# Patient Record
Sex: Male | Born: 1965 | Race: Black or African American | Hispanic: No | Marital: Married
Health system: Midwestern US, Community
[De-identification: ages and names within clinical notes are randomized; demographics above are authoritative.]

---

## 2000-01-29 ENCOUNTER — Encounter: Admission: RE | Admit: 2000-01-29 | Discharge: 2000-01-29 | Payer: Self-pay | Admitting: *Deleted

## 2000-01-29 ENCOUNTER — Encounter: Payer: Self-pay | Admitting: *Deleted

## 2002-02-05 ENCOUNTER — Encounter: Payer: Self-pay | Admitting: Family Medicine

## 2002-02-05 ENCOUNTER — Inpatient Hospital Stay (HOSPITAL_COMMUNITY): Admission: RE | Admit: 2002-02-05 | Discharge: 2002-02-07 | Payer: Self-pay | Admitting: Family Medicine

## 2002-08-10 ENCOUNTER — Emergency Department (HOSPITAL_COMMUNITY): Admission: EM | Admit: 2002-08-10 | Discharge: 2002-08-10 | Payer: Self-pay | Admitting: Emergency Medicine

## 2005-01-30 ENCOUNTER — Emergency Department (HOSPITAL_COMMUNITY): Admission: EM | Admit: 2005-01-30 | Discharge: 2005-01-30 | Payer: Self-pay | Admitting: Emergency Medicine

## 2005-02-28 ENCOUNTER — Inpatient Hospital Stay (HOSPITAL_COMMUNITY): Admission: EM | Admit: 2005-02-28 | Discharge: 2005-03-02 | Payer: Self-pay | Admitting: Family Medicine

## 2005-03-01 ENCOUNTER — Ambulatory Visit: Payer: Self-pay | Admitting: *Deleted

## 2005-03-02 ENCOUNTER — Encounter: Payer: Self-pay | Admitting: Cardiovascular Disease

## 2005-03-04 ENCOUNTER — Emergency Department (HOSPITAL_COMMUNITY): Admission: EM | Admit: 2005-03-04 | Discharge: 2005-03-04 | Payer: Self-pay | Admitting: Emergency Medicine

## 2005-03-09 ENCOUNTER — Ambulatory Visit: Payer: Self-pay | Admitting: *Deleted

## 2005-03-23 ENCOUNTER — Ambulatory Visit (HOSPITAL_COMMUNITY): Admission: RE | Admit: 2005-03-23 | Discharge: 2005-03-23 | Payer: Self-pay | Admitting: *Deleted

## 2005-03-23 ENCOUNTER — Ambulatory Visit: Payer: Self-pay | Admitting: *Deleted

## 2005-04-07 ENCOUNTER — Ambulatory Visit: Payer: Self-pay | Admitting: *Deleted

## 2006-10-05 ENCOUNTER — Emergency Department (HOSPITAL_COMMUNITY): Admission: EM | Admit: 2006-10-05 | Discharge: 2006-10-05 | Payer: Self-pay | Admitting: Emergency Medicine

## 2006-10-07 ENCOUNTER — Emergency Department (HOSPITAL_COMMUNITY): Admission: EM | Admit: 2006-10-07 | Discharge: 2006-10-07 | Payer: Self-pay | Admitting: Emergency Medicine

## 2007-10-14 ENCOUNTER — Emergency Department (HOSPITAL_COMMUNITY): Admission: EM | Admit: 2007-10-14 | Discharge: 2007-10-14 | Payer: Self-pay | Admitting: Emergency Medicine

## 2008-01-22 ENCOUNTER — Emergency Department (HOSPITAL_COMMUNITY): Admission: EM | Admit: 2008-01-22 | Discharge: 2008-01-22 | Payer: Self-pay | Admitting: Emergency Medicine

## 2008-12-17 IMAGING — CT CT HEAD W/O CM
1 series · 16 of 30 positions shown, 20 images · IV contrast (agent unspecified)
Comparison: 03/04/05.

CLINICAL DATA: Headache.  Visual disturbance. Right eye vision loss.  
 HEAD CT WITHOUT CONTRAST:
TECHNIQUE: Contiguous axial images were obtained from the base of the skull through the vertex according to standard protocol without contrast.

[Series 2: brain · axial · 0.47mm/px · z∈[+103,+245]mm · 16 of 32 slices shown, 20 images]
[im 2/32  brain]
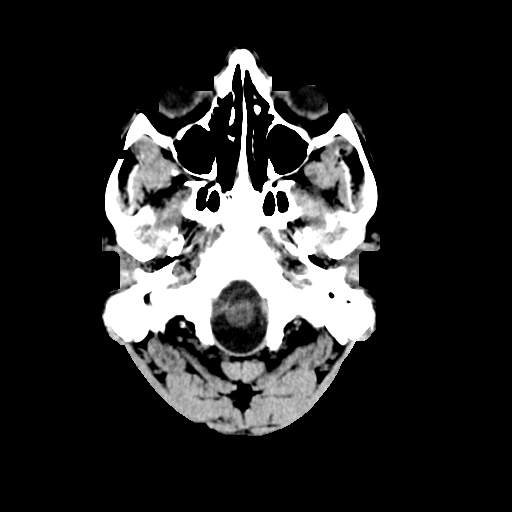
[im 2/32  bone]
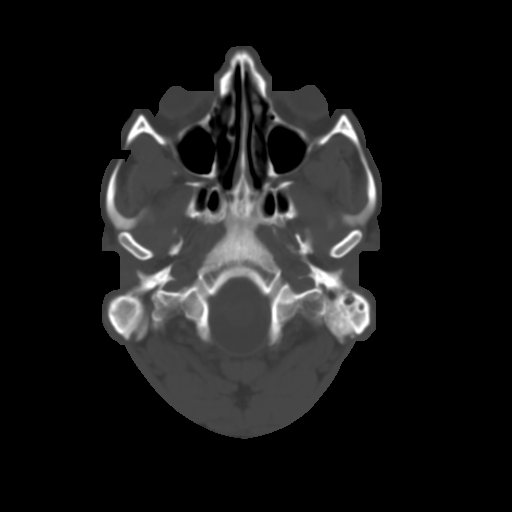
[im 4/32  brain]
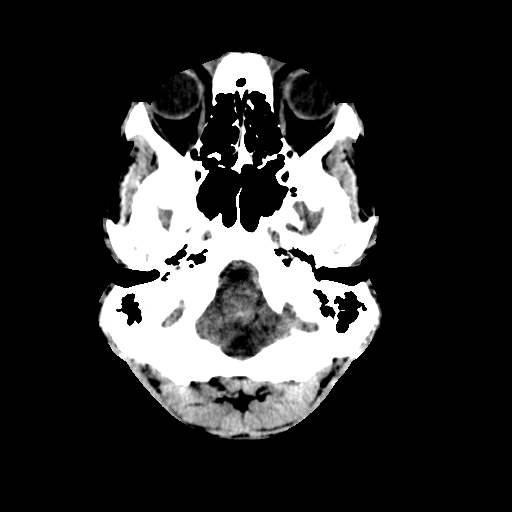
[im 6/32  brain]
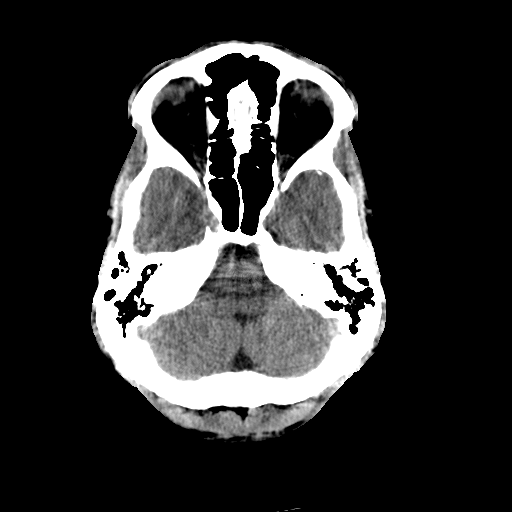
[im 8/32  brain]
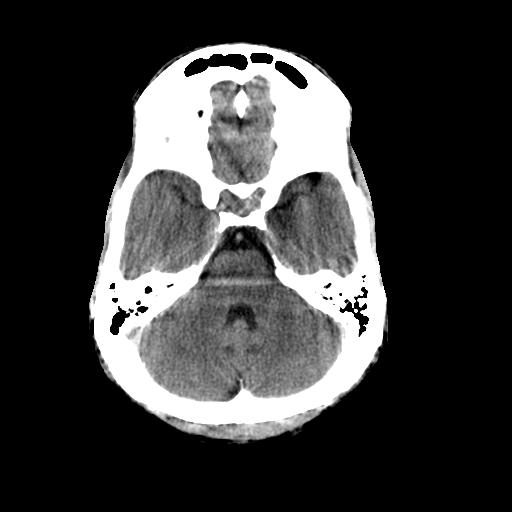
[im 9/32  brain]
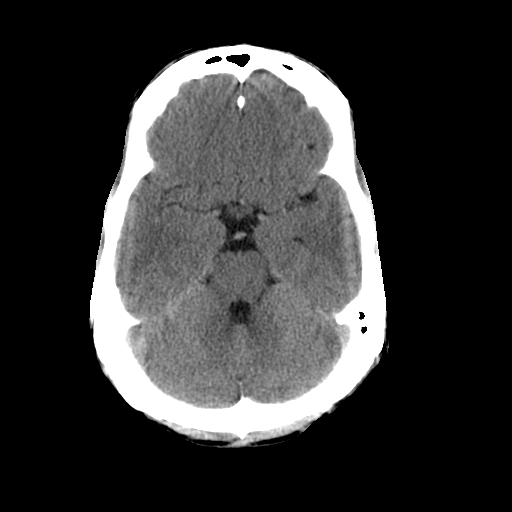
[im 9/32  bone]
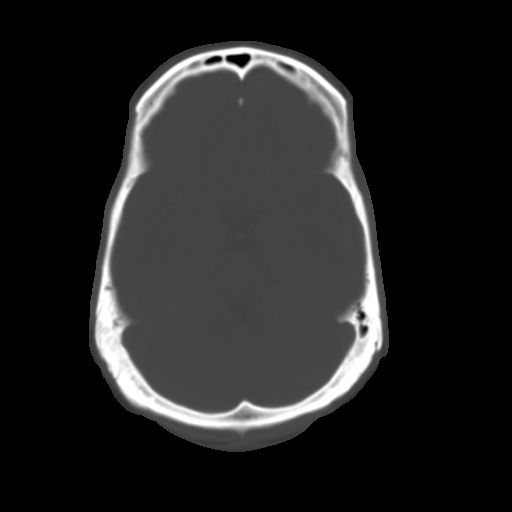
[im 11/32  brain]
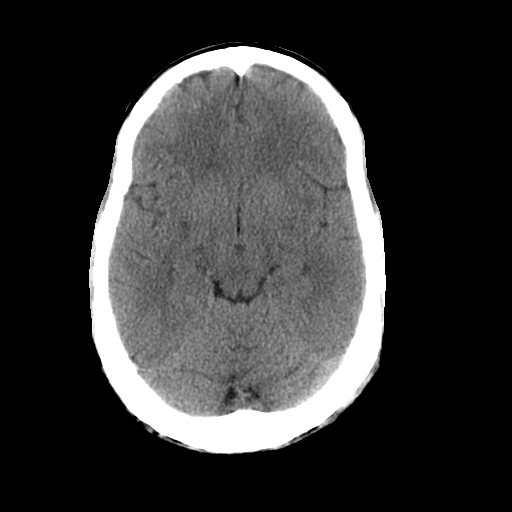
[im 13/32  brain]
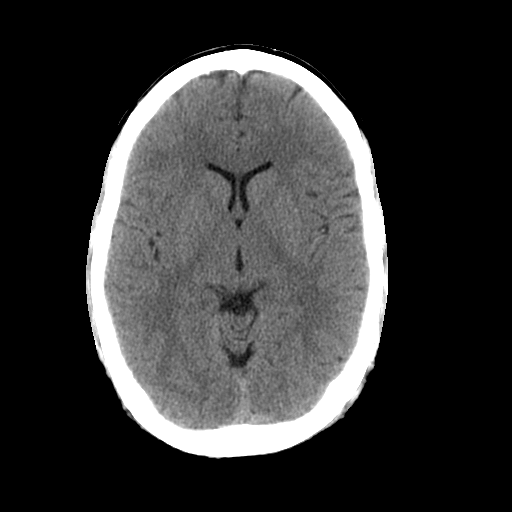
[im 15/32  brain]
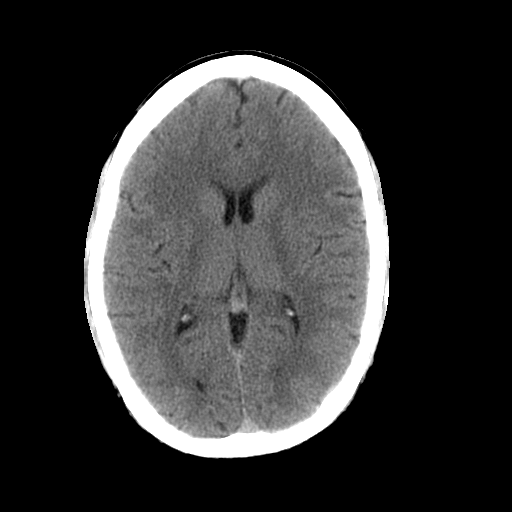
[im 17/32  brain]
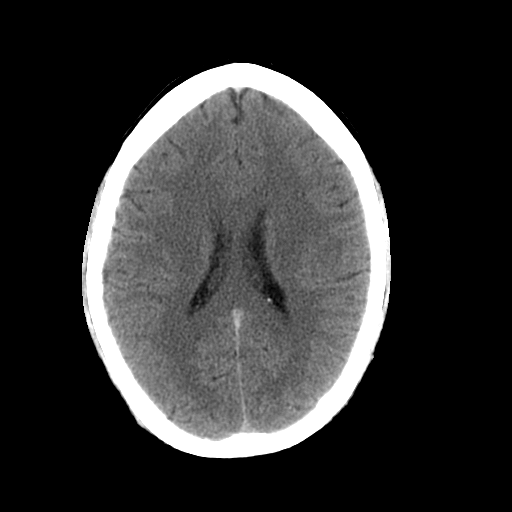
[im 17/32  bone]
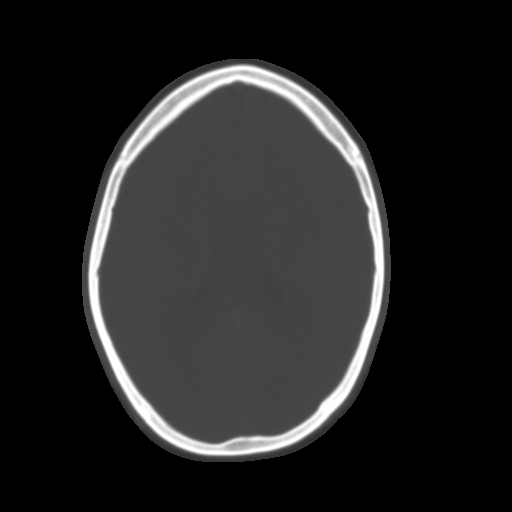
[im 19/32  brain]
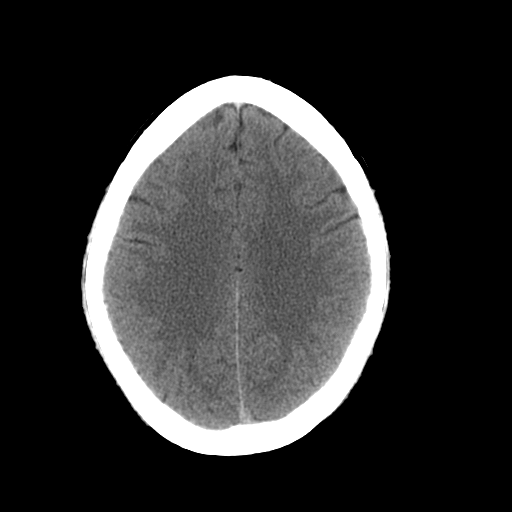
[im 21/32  brain]
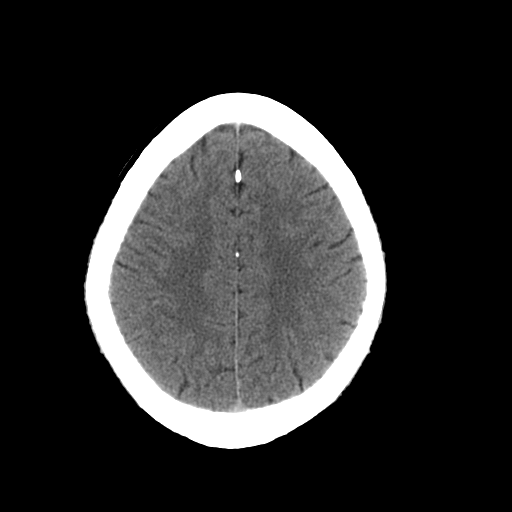
[im 23/32  brain]
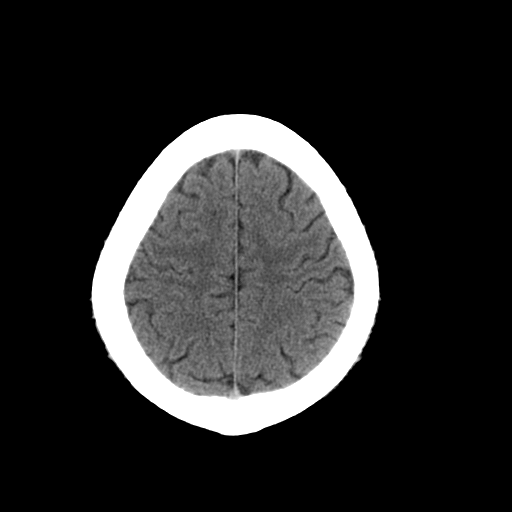
[im 24/32  brain]
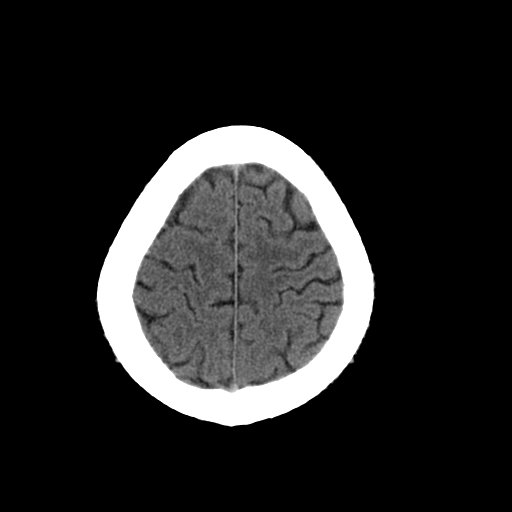
[im 24/32  bone]
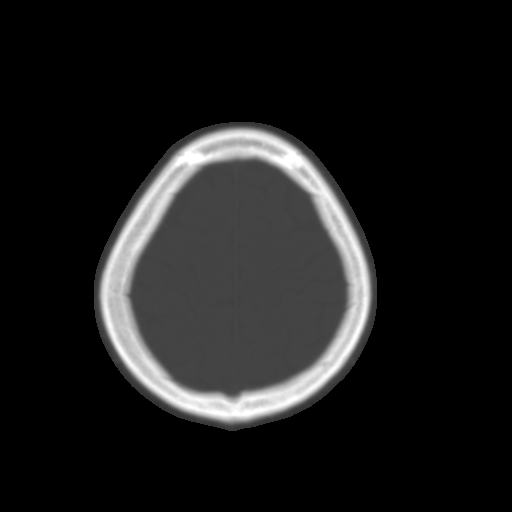
[im 26/32  brain]
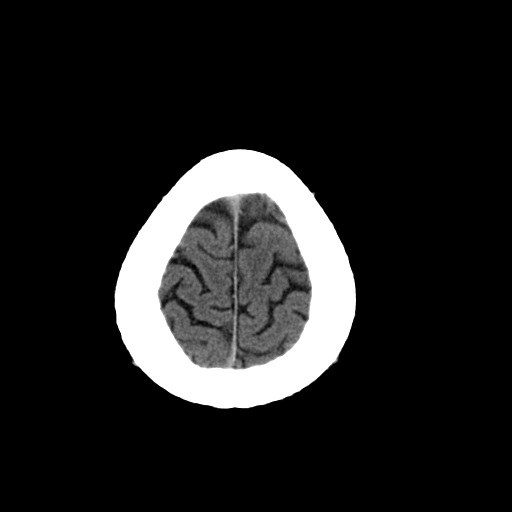
[im 28/32  brain]
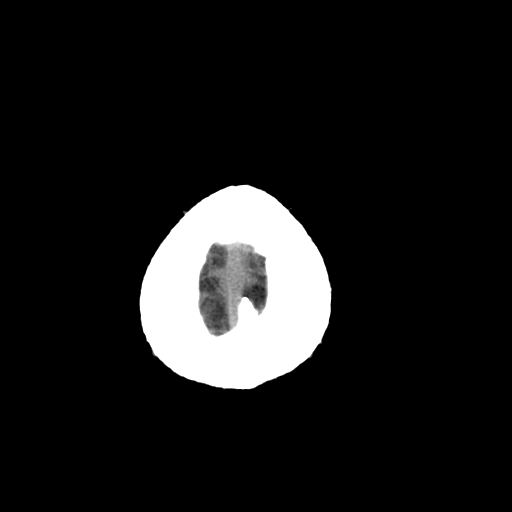
[im 30/32  brain]
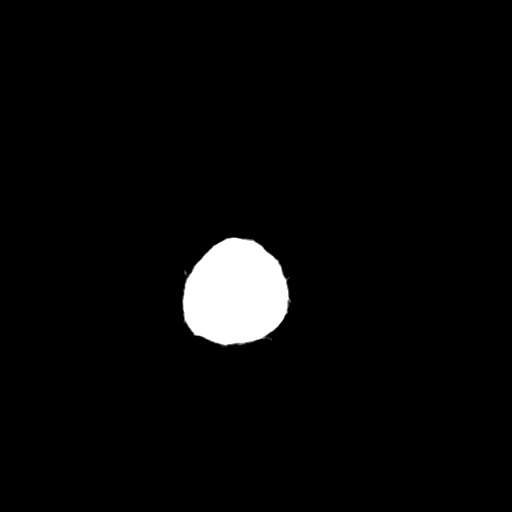

[16 of 30 positions shown; findings below may reference images not displayed]

FINDINGS: The brain has a normal appearance without evidence of atrophy, stroke, mass, hemorrhage, hydrocephalus, or extraaxial collection.  The calvarium is unremarkable.  The paranasal sinuses, middle ears and mastoids are clear.
IMPRESSION: Normal examination.

## 2011-04-08 NOTE — Consult Note (Signed)
NAME:  Darryl [name], Darryl [name]             ACCOUNT NO.:  [id number]   MEDICAL RECORD NO.:  [id number]          PATIENT TYPE:  INP   LOCATION:  A203                          FACILITY:  [facility]   PHYSICIAN:  Vida Roller, M.D.   DATE OF BIRTH:  May 12, 1966   DATE OF CONSULTATION:  03/01/2005  DATE OF DISCHARGE:                                   CONSULTATION   PRIMARY CARE PHYSICIAN:  Scott A. Luking, MD   HISTORY OF PRESENT ILLNESS:  Mr. Durbin is a 45 year old black male with  no significant past medical history except for migraine headaches who  presented with two days of discomfort in his chest.  Was admitted by Dr.  Gerda Diss on February 28, 2005.  Ruled out for myocardial infarction by enzymes  and EKGs.  He underwent an exercise perfusion study today that showed  evidence of inferior wall ischemia, and we were asked to evaluate him.  His  chest discomfort appears to be sharp.  It is nearly constant with a waxing  and waning feature.  It is no associated with any exertion, although he did  have some on the treadmill today.  He denies any fevers or chills, nausea or  vomiting.  He has no diaphoresis.  There are no palpitations.  There is no  radiation to his pain.  He has no PND or orthopnea.  He is an otherwise  healthy guy.  He has no past medical history other than the migraine  headaches for which he takes verapamil on a regular basis.  He does not  smoke, drink, or use illicit drugs.  He is not allergic to any medications.  He has a family history of prostate cancer but no heart disease, no  diabetes.  He does have a family history of hypertension.   REVIEW OF SYSTEMS:  Generally negative except for that reviewed in the  history of present illness.  Negative for headache, shortness of breath,  tingling.  He has no sinus discharge.  No visual changes.  No changes in his  hearing.  He does not have any wheezing.  There are no thyroid problems.  He  has no changes in his appetite.  There is  no melena or hematochezia.  There  is no history of dyspepsia.  He has never had any problems with his urinary  stream or his sexual performance.  He has no joint pain.  The rest of his  review of systems is negative.   PHYSICAL EXAMINATION:  VITAL SIGNS:  Pulse 54, blood pressure 130/84.  GENERAL:  He is a well-developed and well-nourished African-American male,  kind of thin, in no apparent distress.  Alert and oriented x4.  HEENT:  Unremarkable.  NECK:  Supple.  There is no jugular venous distention or carotid bruits.  CHEST:  Clear to auscultation bilaterally.  CARDIAC:  Regular. There is an S4 but otherwise a normal exam with  nondisplaced point of maximal impulse.  ABDOMEN:  Soft and nontender with no bruits.  EXTREMITIES:  Without clubbing, cyanosis or edema.  Distal pulses are intact  to 2+ with no evidence  of bruits.   His electrocardiogram shows sinus bradycardia at a rate of 49 with normal  intervals.  His P-R interval is 208 milliseconds but otherwise normal.  He  has voltage criteria for left ventricular hypertrophy with early  repolarization, which is probable normal variant for his age.  He has no  ischemic ST/T wave changes, and there are no T waves concerning for an old  myocardial infarction.  He does have T waves in leads V4, V5, and V6, which  are not consistent with myocardial infarction.   Chest x-ray shows no acute disease.   Routine screening laboratories are all within normal limits.   Stress test is as described above.   This is a gentleman with atypical chest pain but with an abnormal perfusion  study who had excellent exercise tolerance.  No ischemia on his resting  electrocardiogram with normal left ventricular systolic function.  I suspect  this is probably a false positive test, but he reproduces discomfort on the  treadmill, so I think it is probably worthwhile to exclude significant  coronary disease and also coronary anomaly.  It is possible with  his  migraine history that he may have a vasospastic anginal component to this,  although that is very unlikely.  He also has a history of migraine  headaches, which is well controlled on verapamil.  So my plan is to  transport him to [facility].  We will use Lovenox and aspirin until he is  cathed.  I am going to change from verapamil to Lopressor and will move  forward with his evaluation.      JH/MEDQ  D:  03/01/2005  T:  03/01/2005  Job:  [id number]

## 2011-04-08 NOTE — Discharge Summary (Signed)
NAME:  Rosebrook, Jasean             ACCOUNT NO.:  [id number]   MEDICAL RECORD NO.:  [id number]          PATIENT TYPE:  INP   LOCATION:                               FACILITY:  [facility]   PHYSICIAN:  Dorian Pod, NP    DATE OF BIRTH:  [birth year]   DATE OF ADMISSION:  02/28/2005  DATE OF DISCHARGE:  03/02/2005                                 DISCHARGE SUMMARY   DISCHARGE DIAGNOSES:  1.  Chest pain, negative cardiac enzymes, status post cardiac      catheterization on March 01, 2005.  No obstructive coronary artery      disease.  2.  History of migraines.   PAST MEDICAL HISTORY:  Appendicitis in 2003 status post appendectomy, and  migraine headaches with initiation of beta blocker x3-4 weeks prior to this  admission.   DISPOSITION:  Home with instructions to continue his previous medications  which include:  1.  Lopressor 50 mg p.o. b.i.d.  2.  Aspirin 325 mg p.o. daily.   FOLLOW UP:  Dr. Marchelle Folks PA in two weeks at the [place] office.  The  patient instructed to call her office at [phone] for followup appointment.  He is also instructed to call our office for any fever, any pain, or  swelling from cath site.   DIET:  Low fat.   ACTIVITY:  No lifting over 10 pounds x1 week, no driving for two days.  Tylenol for general discomfort.   HOSPITAL COURSE:  Mr. Froh is a patient of Dr. Lilyan Punt.  He is a  45 year old African-American gentleman with history of migraine headaches  who initially presented to [facility] with complaints of x2 days of  discomfort in his chest.  Was admitted there by Dr. Gerda Diss.  Ruled out for  myocardial infarction by enzymes and EKG.  Underwent an exercise stress test  that showed evidence of inferior wall ischemia, and we were asked to  evaluate him further.  The patient was transferred to [facility] for cardiac  catheterization.  Taken to the cath lab on April 11, left heart cath.  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  However, the LAD  abnormality is not in the same distribution as the Cardiolite defect.  It  does not appear to be flow-limiting and risk factor reduction will be  recommended.  The patient tolerated catheterization without any  complications.  Post cath seen by Dr. Riley Kill.  Afebrile.  Lab work  stable with hemoglobin of 14.2, hematocrit 41.6, BUN 14, creatinine 1.1,  triglycerides 125, total cholesterol 158, HDL 41, LDL 92, the patient in  normal sinus rhythm.  Cath site without complications.  The patient  discharged home with followup instructions as stated above.      MB/MEDQ  D:  04/19/2005  T:  04/19/2005  Job:  [id number]   cc:   Lorin Picket A. Gerda Diss, MD  [address]  [place]  [place] [address]  Fax: [phone]

## 2011-04-08 NOTE — Op Note (Signed)
[facility]  Patient:    [facility], Bralyn A Visit Number: [id number] MRN: [id number]          Service Type: MED Location: 3A A335 01 Attending Physician:  Lilyan Punt Dictated by:   Elpidio Anis, M.D. Admit Date:  02/05/2002 Discharge Date: 02/07/2002   CC:         Donna Bernard, M.D.   Operative Report  PREOPERATIVE DIAGNOSIS:  Acute appendicitis.  POSTOPERATIVE DIAGNOSIS:  Acute appendicitis.  PROCEDURE:  Laparoscopic appendectomy.  SURGEON:  Elpidio Anis, M.D.  DESCRIPTION OF PROCEDURE:  Under general anesthesia, the patients abdomen was prepped and draped in a sterile field.  Subumbilical incision was made and Veress needle was inserted uneventfully.   Abdomen was insufflated with three liters of CO2.  He is _____ .  A 10 mm port was placed uneventfully. Laparoscope was placed.  A midline suprapubic incision was made and a 5 mm port was placed under videoscopic guidance.  Thereby a lower quadrant transverse incision was made and a 12 mm port was placed under videoscopic guidance.  The cecum was identified and mobilized.  There were dense adhesions of the cecum and the appendix in the right gutter.  There was also evidence of acute inflammation of the tip of the appendix.  The adhesions were dissected with a _____ clamp, clipped with multiple clips and divided.  The mesoappendix was dissected at the base.  It was divided using an endo-GIA with 2.5 mm staplers.  The base of the appendix was then transected using the same stapler.  Hemostasis was achieved.  There was no bleeding noted.  The appendix was placed in an endocatch device and then retrieved uneventfully.  Further irrigation was carried out.  There was no bleeding.  The staple line appeared to be intact.  The patient tolerated the procedure well.  CO2 was allowed to escape from the abdomen and the ports were removed.  The incisions were closed using 0 Dexon on the fascia of the  larger incisions and staples on the skin. Dressings were placed.  He was awakened from anesthesia uneventfully and transferred to a bed and taken to the post anesthesia care unit for further monitoring. Dictated by:   Elpidio Anis, M.D. Attending Physician:  Lilyan Punt DD:  02/06/02 TD:  02/08/02 Job: [id number]

## 2011-04-08 NOTE — Procedures (Signed)
NAME:  Darryl [name], Darryl [name]             ACCOUNT NO.:  [id number]   MEDICAL RECORD NO.:  [id number]          PATIENT TYPE:  INP   LOCATION:  A203                          FACILITY:  [facility]   PHYSICIAN:  Scott A. Luking, MD    DATE OF BIRTH:  July 10, 1966   DATE OF PROCEDURE:  DATE OF DISCHARGE:                                    STRESS TEST   INDICATION:  Chest discomfort and abnormal electrocardiogram.  Resting EKG  shows a normal sinus rhythm and no acute ST segment changes.  Early  repolarization noted to be 5B6.   PROTOCOL:  Bruce protocol with Cardiolite.   EXERCISE PHASE:  No acute symptomatology or ST segment changes were noted.  No arrhythmias.  Blood pressure did go up, but not drastically.   RECOVERY PHASE:  Uneventful.  No arrhythmias.   INTERPRETATION:  Normal stress test.  Await Cardiolite images.      SAL/MEDQ  D:  03/01/2005  T:  03/01/2005  Job:  [id number]

## 2011-04-08 NOTE — Procedures (Signed)
NAME:  Darryl [name], Darryl [name]             ACCOUNT NO.:  [id number]   MEDICAL RECORD NO.:  [id number]          PATIENT TYPE:  OUT   LOCATION:  RAD                           FACILITY:  [facility]   PHYSICIAN:  Vida Roller, M.D.   DATE OF BIRTH:  1966-09-30   DATE OF PROCEDURE:  03/23/2005  DATE OF DISCHARGE:                                  ECHOCARDIOGRAM   PRIMARY CARE PHYSICIAN:  Scott A. Luking, MD   Tape Number [id number], tape count [id number] through 6231.   This is a 45 year old man with hypertension.   Technical quality study is difficult, although the echocardiographic windows  appear excellent.   M-MOD TRACINGS:  Aorta is 25 mm.   Left atrium is 38 mm.   The septum is 11 mm.   Posterior wall is 11 mm.   Left ventricular diastolic dimension is 43 mm.   Left ventricular systolic dimension is 34 mm.   2-D AND DOPPLER IMAGING:  The left ventricle is normal size with low-normal  ejection fraction, estimated ejection fraction would be 50-55%. There are no  obvious wall motion abnormalities. Borderline left ventricular hypertrophy,  which is concentric.   The right ventricle appears to be top-normal in size with normal systolic  function.   Both atria appear to be normal size.   The aortic valve is morphologically unremarkable with no stenosis or  regurgitation.   The mitral valve is morphologically unremarkable with trivial insufficiency.  No stenosis is seen.   Tricuspid valve is morphologically unremarkable with no stenosis or  regurgitation.   Pulmonic valve not well seen.   Inferior vena cava appears normal size.   There is no pericardial effusion.   Assessment if the aorta is poor. There is no assessment of the suprasternal  notch view.   ASSESSMENT:  Unremarkable echocardiogram.      JH/MEDQ  D:  03/23/2005  T:  03/23/2005  Job:  [id number]   cc:   Lorin Picket A. Gerda Diss, MD  [address]  [place]  [place] [address]  Fax: [phone]

## 2011-04-08 NOTE — Discharge Summary (Signed)
[facility]  Patient:    [facility], Talon A Visit Number: [id number] MRN: [id number]          Service Type: MED Location: 3A A335 01 Attending Physician:  Lilyan Punt Dictated by:   Lilyan Punt, M.D. Admit Date:  02/05/2002 Discharge Date: 02/07/2002                             Discharge Summary  DISCHARGE DIAGNOSES: 1. Acute appendicitis. 2. Vomiting secondary to #1.  HOSPITAL COURSE:  A 45 year old black male admitted in with severe abdominal pain localized to the right quadrant area.  The scan showed inflammation in the appendix.  He is placed on IV fluids, IV antibiotics, had surgery the following morning.  He tolerated that well and overall did fine and was able to be discharged to home on the following day and instructed to follow up if any problems, otherwise, followup with Dr. Katrinka Blazing as advised by him and to take it easy over the course of the next several days.  The patient tolerated the procedure well without any complications. Dictated by:   Lilyan Punt, M.D. Attending Physician:  Lilyan Punt DD:  02/22/02 TD:  02/23/02 Job: [id number]

## 2011-04-08 NOTE — H&P (Signed)
[facility]  Patient:    Darryl [name], Darryl [name] Visit Number: [id number] MRN: [id number]          Service Type: MED Location: 2A A218 01 Attending Physician:  Lilyan Punt Dictated by:   Lilyan Punt, M.D. Admit Date:  02/05/2002                           History and Physical  CHIEF COMPLAINT:  Abdominal pain, right side.  HISTORY OF PRESENT ILLNESS:  This 45 year old black male relates onset of abdominal pain early in the morning.  It became more progressive as the day went on and localized to the right lower quadrant.  He states he had difficulty moving about, walking around and states any jarring in the car made it hurt worse.  He stated that the pain started off as [name] 3/10 but is now currently an 8/10.  He did eat lunch at [employer] but felt slightly nauseated, has not had anything since then.  Denies dysuria but does relate increased urinary frequency.  He denies cough.  He denies shortness of breath and chest tightness.  No fever or chills.  No rectal bleeding, diarrhea or vomiting.  Relates nausea.  PAST MEDICAL HISTORY:  No surgeries.  No medical problems.  SOCIAL HISTORY:  Does not smoke or drink.  ALLERGIES:  No allergies.  FAMILY HISTORY:  Hypertension.  Dad had prostate cancer.  REVIEW OF SYSTEMS:  Negative for headaches, see per above for other details.  PHYSICAL EXAMINATION:  GENERAL:  NAD.  Looks to feel in somewhat pain.  VITAL SIGNS:  BP 130/100; recheck was 126/84.  Temperature 97.2.  Weight 160 pounds.  HEENT:  TMs NL.  T - NL.  NECK:  Supple No masses.  CHEST:  CTA.  HEART:  NL.  Heart is regular.  No murmurs.  ABDOMEN:  Soft on the left side but tender on the right with exquisite tenderness in the right lower quadrant.  No rebound.  Rates pain as an 8/10.  RECTAL:  Prostate WNL.  LABORATORY AND ACCESSORY DATA:  White count 9000.  Other labs are pending currently.  Urinalysis negative in the office.  CT scan  shows prominent appendix, suspect early appendicitis.  Chest x-ray is pending.  ASSESSMENT AND PLAN:  Probable appendicitis -- intravenous fluids, intravenous antibiotics, repeat complete blood count in the morning.  I have spoken with Dr. Elpidio Anis and told him my concerns about the possibility of appendicitis; he agrees to come to see the patient this evening for evaluation for possible surgery.  Timing of surgery will be up to Dr. Elpidio Anis. Dictated by:   Lilyan Punt, M.D. Attending Physician:  Lilyan Punt DD:  02/05/02 TD:  02/06/02 Job: [id number]

## 2011-04-08 NOTE — H&P (Signed)
NAME:  Darryl [name], Darryl [name]             ACCOUNT NO.:  [id number]   MEDICAL RECORD NO.:  [id number]          PATIENT TYPE:  INP   LOCATION:  A203                          FACILITY:  [facility]   PHYSICIAN:  Scott A. Gerda Diss, MD    DATE OF BIRTH:  12/09/1965   DATE OF ADMISSION:  02/28/2005  DATE OF DISCHARGE:  LH                                HISTORY & PHYSICAL   CHIEF COMPLAINT:  Chest discomfort.   HISTORY OF PRESENT ILLNESS:  This is a 45 year old black male who presents  today with substernal chest discomfort.  The pain has been present for two  days.  He describes sometimes a sharp but other times this aching.  He  denies any increase in pain with ROM.  He denies any numbness or tingling  into the arm.  Denies SOB, sweats, chills, nausea or vomiting.  He states  that energy level overall is sub-par.  He states feeling fatigued and tired  recently.  He has a history of headaches.  He has been on medication for  several weeks for migraine prophylaxis with beta blocker.  He denies feeling  depressed.  He denies having suicidal thoughts.   FAMILY HISTORY:  Hypertension.  Prostate cancer with grandfather.  No  premature heart disease.   SOCIAL HISTORY:  Does not smoke or drink.   ALLERGIES:  None.   Has been in the hospital once with appendicitis in 2003, had surgery done.   PHYSICAL EXAMINATION:  GENERAL:  AD.  VITAL SIGNS:  BP with mild elevation 134/94.  HEENT:  TM's NLT.  NECK:  Supple.  CHEST:  CTPA.  Chest wall nontender.  HEART:  Regular.  No murmurs.  ABDOMEN:  Soft.  EXTREMITIES:  No edema.  SKIN: Warm and dry.   Cardiac enzymes negative.   EKG shows early repole but also has ST segment elevation of V4-6 that is  somewhat concerning as well.   ASSESSMENT/PLAN:  Chest pain with marginally abnormal EKG.  I feel this  patient needs to be admitted into the hospital.  Cardiac enzymes through the  night.  If all negative, do stress Cardiolite tomorrow.  If this is  negative,  then it could well be reflux as the source of this.  Monitor  patient closely through the night.  Please see orders.      SAL/MEDQ  D:  02/28/2005  T:  02/28/2005  Job:  [id number]

## 2011-04-08 NOTE — H&P (Signed)
[facility]  Patient:    Darryl [name], Darryl [name] Visit Number: [id number] MRN: [id number]          Service Type: MED Location: 2A A218 01 Attending Physician:  Lilyan Punt Dictated by:   Elpidio Anis, M.D. Admit Date:  02/05/2002   CC:         Lilyan Punt, M.D.   History and Physical  HISTORY OF PRESENT ILLNESS:  This is [name] 45 year old male with history of onset of severe abdominal pain since yesterday, about midday.  The pain became quite severe.  He had difficulty walking.  He had nausea without vomiting.  The patient eventually was seen in the emergency room last evening and admitted for possible appendicitis.  CT of the abdomen shows questionable swelling at the appendix, but there was no free fluid, no periappendiceal inflammation.  The patient has normal white count and normal labs otherwise, and he was afebrile.  He has been monitored overnight and has continued pain which is localized in the right lower quadrant.  He is, therefore scheduled to have appendectomy.  PAST HISTORY:  The patient is totally healthy.  He has no medical problems. He has had no surgery.  SOCIAL HISTORY:  He does not drink, smoke or use drugs.  FAMILY HISTORY:  Positive for hypertension.  REVIEW OF SYSTEMS:  Entirely negative.  CHRONIC MEDICATIONS:  None.  PHYSICAL EXAMINATION:  GENERAL:  He does not appear to be in acute distress, but he has had analgesics.  VITAL SIGNS:  Blood pressure 120/80, pulse 60, respirations 16, temperature 98 degrees.  HEENT:  Unremarkable.  NECK:  Supple without JVD or bruit.  CHEST:  Clear to auscultation.  No rales, rubs, rhonchi or wheezes.  HEART:  Regular rate and rhythm without murmur, gallop, or rub.  ABDOMEN:  Nondistended, good active normoactive bowel sounds.  Moderate tenderness with guarding and rebound, right flank and right lower quadrant. No masses.  EXTREMITIES:  No clubbing, cyanosis, or  edema.  NEUROLOGIC:  No focal motor, sensory or cerebellar deficit.  IMPRESSION:  Acute abdomen with suspected acute appendicitis.  PLAN:  Laparoscopic appendectomy.   The patient was counseled in the presence of his wife.  The procedure was explained in detail.  The potential complications including infection, injury to surrounding viscera, and anesthetic complications were explained to the patient.  He will have appendectomy later today. Dictated by:   Elpidio Anis, M.D. Attending Physician:  Lilyan Punt DD:  02/06/02 TD:  02/06/02 Job: [id number]

## 2011-04-08 NOTE — Discharge Summary (Signed)
NAME:  [name], Darryl             ACCOUNT NO.:  [id number]   MEDICAL RECORD NO.:  [id number]         PATIENT TYPE:  CINP   LOCATION:                               FACILITY:  [facility]   PHYSICIAN:  Arturo Morton. Riley Kill, M.D. [facility] OF BIRTH:  February 22, 1966   DATE OF ADMISSION:  02/28/2005  DATE OF DISCHARGE:  03/02/2005                                 DISCHARGE SUMMARY   ADDENDUM:  Regarding the discharge instructions, it is not clear whether or  not Dr. Riley Kill discharged the patient home on Lopressor which would be a  new medication for him, or instructed him to continue the Verapamil which he  was on prior to admission to [facility].  Further clarification  will be needed when the patient follows up with Dr. Dorethea Clan.       MB/MEDQ  D:  04/19/2005  T:  04/19/2005  Job:  [id number]

## 2011-04-08 NOTE — Cardiovascular Report (Signed)
NAME:  Darryl [name], Darryl [name]             ACCOUNT NO.:  [id number]   MEDICAL RECORD NO.:  [id number]          PATIENT TYPE:  INP   LOCATION:  6527                         FACILITY:  [facility]   PHYSICIAN:  Arturo Morton. Riley Kill, M.D. 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  He has had some chest discomfort.  A Cardiolite  suggested posterior wall ischemia.  Based on this, he was referred for  cardiac catheterization.  EKG reveals some probable early repolarization  with left ventricular hypertrophy.   PROCEDURES:  1.  Left heart catheterization.  2.  Selective coronary arteriography.  3.  Selective left ventriculography.   DESCRIPTION OF PROCEDURE:  The patient was brought to the catheterization  laboratory, prepped and draped in usual fashion.  Through anterior puncture,  the right femoral artery was easily entered.  A 4-French sheath was placed.  Central aortic and left ventricular pressures were measured with a pigtail  catheter.  Ventriculography was performed in the RAO projection.  Left  coronary arteriography was then performed with a JL4 catheter.  Multiple  views of the proximal left anterior descending artery were obtained in a  cone down fashion.  Following this, RCA angiography was performed.  He was  taken to the holding area in satisfactory clinical condition.  He received 2  mg of intravenous Versed during the course of the procedure.   I then reviewed the films with the patient's wife and children.   HEMODYNAMIC DATA:  1.  Central aortic pressure 149/97, mean 119.  2.  Left atrial pressure 148/14.  3.  No gradient pullback across aortic valve.   ANGIOGRAPHIC DATA:  1.  Ventriculography in the RAO projection reveals preserved global systolic      function.  No segmental abnormalities  or contraction identified.  2.  The right coronary artery is smooth throughout providing a posterior      descending and posterolateral system.  No significant focal      abnormalities were seen.  3.  The left main coronary artery is free of critical disease.  4.  The left anterior descending artery courses to the apex.  We took      multiple views of the proximal left anterior descending artery. There is      mild focal narrowing right at the ostium.  Whether this is truly      atherosclerotic or related to foreshortening is uncertain, but in      multiple views, there is a suggestion of narrowing in the range of about      30%.  It does not appear to be hemodynamically significant. The distal      LAD wraps the apex and no significant areas of high-grade obstruction      are identified.  5.  The circumflex provides two major marginal branches.  This appears to be      free of significant disease.   CONCLUSION:  1.  Well-preserved  left ventricular function.  2.  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   The LAD abnormality is not the same distribution as the Cardiolite defect.  It also does not appear to be flow-limiting.  Risk factor reduction will be  recommended.  The patient has also had some recurrent migraine headaches.  This may need further evaluation.      TDS/MEDQ  D:  03/02/2005  T:  03/02/2005  Job:  [id number]   cc:   Vida Roller, M.D.  Fax: [phone]   Scott A. Gerda Diss, MD  [address]  [place]  [place] [address]  Fax: [phone]   [facility]

## 2011-08-30 LAB — CBC
HCT: 43.2
Hemoglobin: 14.4
MCHC: 33.3
MCV: 86.9
RBC: 4.97
RDW: 12.6
WBC: 6

## 2011-08-30 LAB — DIFFERENTIAL
Basophils Absolute: 0
Eosinophils Relative: 1
Lymphocytes Relative: 29
Lymphs Abs: 1.7
Monocytes Absolute: 0.4
Neutro Abs: 3.8
Neutrophils Relative %: 63

## 2019-08-15 ENCOUNTER — Inpatient Hospital Stay
Admit: 2019-08-15 | Discharge: 2019-08-17 | Disposition: A | Discharge status: Home / Self Care | Payer: BLUE CROSS/BLUE SHIELD | Attending: Internal Medicine | Admitting: Internal Medicine

## 2019-08-15 DIAGNOSIS — T63001A Toxic effect of unspecified snake venom, accidental (unintentional), initial encounter: Secondary | ICD-10-CM

## 2019-08-15 LAB — CBC WITH AUTO DIFFERENTIAL
Basophils %: 0 % (ref 0–1)
Basophils Absolute: 0 10*3/uL (ref 0.0–0.1)
Eosinophils %: 2 % (ref 0–7)
Eosinophils Absolute: 0.1 10*3/uL (ref 0.0–0.4)
Granulocyte Absolute Count: 0 10*3/uL (ref 0.00–0.04)
Hematocrit: 43.9 % (ref 36.6–50.3)
Hemoglobin: 15.2 % (ref 12.1–17.0)
Immature Granulocytes: 0 % (ref 0.0–0.5)
Lymphocytes %: 29 % (ref 12–49)
Lymphocytes Absolute: 1.8 10*3/uL (ref 0.8–3.5)
MCH: 30.3 PG (ref 26.0–34.0)
MCHC: 34.6 g/dL (ref 30.0–36.5)
MCV: 87.6 FL (ref 80.0–99.0)
MPV: 11.5 FL (ref 8.9–12.9)
Monocytes %: 7 % (ref 5–13)
Monocytes Absolute: 0.4 10*3/uL (ref 0.0–1.0)
Neutrophils %: 62 % (ref 32–75)
Neutrophils Absolute: 4 10*3/uL (ref 1.8–8.0)
Platelets: 336 10*3/uL (ref 150–400)
RBC: 5.01 M/uL (ref 4.10–5.70)
RDW: 14.4 % (ref 11.5–14.5)
WBC: 6.4 10*3/uL (ref 4.1–11.1)

## 2019-08-15 LAB — PROTIME-INR
INR: 0.9 (ref 0.9–1.1)
Protime: 12.1 s (ref 11.9–14.7)

## 2019-08-15 LAB — APTT: aPTT: 31.6 s (ref 23.0–35.7)

## 2019-08-15 LAB — HEMOGLOBIN AND HEMATOCRIT
Hematocrit: 44.6 % (ref 36.6–50.3)
Hemoglobin: 15 % (ref 12.1–17.0)

## 2019-08-15 LAB — CBC WITH AUTOMATED DIFF
ABS. BASOPHILS: 0 10*3/uL (ref 0.0–0.1)
ABS. EOSINOPHILS: 0.1 10*3/uL (ref 0.0–0.4)
ABS. IMM. GRANS.: 0 10*3/uL (ref 0.00–0.04)
ABS. LYMPHOCYTES: 1.8 10*3/uL (ref 0.8–3.5)
ABS. MONOCYTES: 0.4 10*3/uL (ref 0.0–1.0)
ABS. NEUTROPHILS: 4 10*3/uL (ref 1.8–8.0)
BASOPHILS: 0 % (ref 0–1)
EOSINOPHILS: 2 % (ref 0–7)
HCT: 43.9 % (ref 36.6–50.3)
HGB: 15.2 % (ref 12.1–17.0)
IMMATURE GRANULOCYTES: 0 % (ref 0.0–0.5)
LYMPHOCYTES: 29 % (ref 12–49)
MCH: 30.3 PG (ref 26.0–34.0)
MCHC: 34.6 g/dL (ref 30.0–36.5)
MCV: 87.6 FL (ref 80.0–99.0)
MONOCYTES: 7 % (ref 5–13)
MPV: 11.5 FL (ref 8.9–12.9)
NEUTROPHILS: 62 % (ref 32–75)
PLATELET: 336 10*3/uL (ref 150–400)
RBC: 5.01 M/uL (ref 4.10–5.70)
RDW: 14.4 % (ref 11.5–14.5)
WBC: 6.4 10*3/uL (ref 4.1–11.1)

## 2019-08-15 LAB — PTT: aPTT: 31.6 s (ref 23.0–35.7)

## 2019-08-15 LAB — PROTHROMBIN TIME + INR
INR: 0.9 (ref 0.9–1.1)
Prothrombin time: 12.1 s (ref 11.9–14.7)

## 2019-08-15 LAB — HGB & HCT
HCT: 44.6 % (ref 36.6–50.3)
HGB: 15 % (ref 12.1–17.0)

## 2019-08-15 MED ORDER — POLYETHYLENE GLYCOL 3350 17 GRAM (100 %) ORAL POWDER PACKET
17 gram | Freq: Every day | ORAL | Status: DC | PRN
Start: 2019-08-15 — End: 2019-08-17

## 2019-08-15 MED ORDER — SODIUM CHLORIDE 0.9 % IJ SYRG
INTRAMUSCULAR | Status: DC | PRN
Start: 2019-08-15 — End: 2019-08-17

## 2019-08-15 MED ORDER — DIPHTH,PERTUS(AC)TETANUS VAC(PF) 2.5 LF UNIT-8 MCG-5 LF/0.5 ML INJ
Freq: Once | INTRAMUSCULAR | Status: AC
Start: 2019-08-15 — End: 2019-08-15
  Administered 2019-08-15: 19:00:00 via INTRAMUSCULAR

## 2019-08-15 MED ORDER — METOPROLOL SUCCINATE SR 25 MG 24 HR TAB
25 mg | Freq: Every day | ORAL | Status: DC
Start: 2019-08-15 — End: 2019-08-17
  Administered 2019-08-16 – 2019-08-17 (×2): via ORAL

## 2019-08-15 MED ORDER — ACETAMINOPHEN 650 MG RECTAL SUPPOSITORY
650 mg | Freq: Four times a day (QID) | RECTAL | Status: DC | PRN
Start: 2019-08-15 — End: 2019-08-17

## 2019-08-15 MED ORDER — CROTALIDAE POLYVAL IMMUNE FAB SOLUTION FOR INJECTION
Freq: Four times a day (QID) | INTRAMUSCULAR | Status: DC | PRN
Start: 2019-08-15 — End: 2019-08-17

## 2019-08-15 MED ORDER — MORPHINE 2 MG/ML INJECTION
2 mg/mL | Freq: Once | INTRAMUSCULAR | Status: AC
Start: 2019-08-15 — End: 2019-08-15
  Administered 2019-08-15: 19:00:00 via INTRAVENOUS

## 2019-08-15 MED ORDER — CROTALIDAE POLYVAL IMMUNE FAB SOLUTION FOR INJECTION
Freq: Four times a day (QID) | INTRAMUSCULAR | Status: DC
Start: 2019-08-15 — End: 2019-08-15
  Administered 2019-08-15: via INTRAVENOUS

## 2019-08-15 MED ORDER — TETANUS AND DIPHTHERIA TOX (PF) 5 LF UNIT-2 LF UNIT/0.5 ML IM SYRINGE
5-2 Lf unit/0.5 mL | Freq: Once | INTRAMUSCULAR | Status: DC
Start: 2019-08-15 — End: 2019-08-15
  Administered 2019-08-15: 16:00:00 via INTRAMUSCULAR

## 2019-08-15 MED ORDER — ONDANSETRON (PF) 4 MG/2 ML INJECTION
4 mg/2 mL | Freq: Four times a day (QID) | INTRAMUSCULAR | Status: DC | PRN
Start: 2019-08-15 — End: 2019-08-17

## 2019-08-15 MED ORDER — ACETAMINOPHEN 325 MG TABLET
325 mg | Freq: Four times a day (QID) | ORAL | Status: DC | PRN
Start: 2019-08-15 — End: 2019-08-16
  Administered 2019-08-16: 10:00:00 via ORAL

## 2019-08-15 MED ORDER — SODIUM CHLORIDE 0.9 % IV PIGGY BACK
3 gram | Freq: Four times a day (QID) | INTRAVENOUS | Status: DC
Start: 2019-08-15 — End: 2019-08-15

## 2019-08-15 MED ORDER — MORPHINE 2 MG/ML INJECTION
2 mg/mL | INTRAMUSCULAR | Status: DC | PRN
Start: 2019-08-15 — End: 2019-08-17

## 2019-08-15 MED ORDER — PROMETHAZINE 25 MG TAB
25 mg | Freq: Four times a day (QID) | ORAL | Status: DC | PRN
Start: 2019-08-15 — End: 2019-08-17

## 2019-08-15 MED ORDER — ENOXAPARIN 40 MG/0.4 ML SUB-Q SYRINGE
40 mg/0.4 mL | Freq: Every day | SUBCUTANEOUS | Status: DC
Start: 2019-08-15 — End: 2019-08-16

## 2019-08-15 MED ORDER — SODIUM CHLORIDE 0.9 % IV
Freq: Once | INTRAVENOUS | Status: DC
Start: 2019-08-15 — End: 2019-08-15

## 2019-08-15 MED ORDER — SODIUM CHLORIDE 0.9 % IJ SYRG
Freq: Three times a day (TID) | INTRAMUSCULAR | Status: DC
Start: 2019-08-15 — End: 2019-08-17
  Administered 2019-08-16: 18:00:00 via INTRAVENOUS

## 2019-08-15 MED FILL — BOOSTRIX TDAP 2.5 LF UNIT-8 MCG-5 LF/0.5 ML INTRAMUSCULAR SYRINGE: INTRAMUSCULAR | Qty: 0.5

## 2019-08-15 MED FILL — CROFAB SOLUTION FOR INJECTION: INTRAMUSCULAR | Qty: 4

## 2019-08-15 MED FILL — SODIUM CHLORIDE 0.9 % IJ SYRG: INTRAMUSCULAR | Qty: 40

## 2019-08-15 MED FILL — TENIVAC (PF) 5 LF UNIT-2 LF UNIT/0.5 ML INTRAMUSCULAR SYRINGE: 5-2 Lf unit/0.5 mL | INTRAMUSCULAR | Qty: 0.5

## 2019-08-15 MED FILL — MORPHINE 2 MG/ML INJECTION: 2 mg/mL | INTRAMUSCULAR | Qty: 1

## 2019-08-15 NOTE — H&P (Signed)
GENERAL GENERIC H&P/CONSULT    Subjective: 53 year old male with past medical history of hypertension presents status post snakebite.  Patient states he was in his usual state of health until earlier this a.m. when he was bitten by a snake on his left hand following which he had significant swelling as well as pain and redness of his left hand following which patient presented to the ED.  Patient denies any fevers chills nausea vomiting lightheadedness dizziness dyspnea orthopnea paroxysmal nocturnal dyspnea chest pain palpitation's headache focal weakness loss of sensation auditory or visual symptom abdominal stool or urinary complaints or any other associated symptoms.  Endorses no recent sick contacts or travel activity  Past Medical History:   Diagnosis Date   ??? Hypertension       No past surgical history on file.   Prior to Admission medications    Not on File     No Known Allergies   Social History     Tobacco Use   ??? Smoking status: Never Smoker   ??? Smokeless tobacco: Never Used   Substance Use Topics   ??? Alcohol use: Not on file      No family history on file.   Review of Systems   Constitutional: Negative for activity change, appetite change, chills, diaphoresis, fatigue, fever and unexpected weight change.   HENT: Negative for congestion, dental problem, drooling, ear discharge, ear pain, facial swelling, hearing loss, mouth sores, nosebleeds, postnasal drip, rhinorrhea, sinus pressure, sinus pain, sneezing, sore throat, tinnitus, trouble swallowing and voice change.    Eyes: Negative for photophobia, pain, discharge, redness, itching and visual disturbance.   Respiratory: Negative for apnea, cough, choking, chest tightness, shortness of breath, wheezing and stridor.    Cardiovascular: Negative for chest pain, palpitations and leg swelling.   Gastrointestinal: Negative for abdominal distention, abdominal pain, anal bleeding, blood in stool, constipation, diarrhea, nausea, rectal pain and vomiting.    Endocrine: Negative for cold intolerance, heat intolerance, polydipsia, polyphagia and polyuria.   Genitourinary: Negative for decreased urine volume, difficulty urinating, discharge, dysuria, enuresis, flank pain, frequency, genital sores, hematuria, penile pain, penile swelling, scrotal swelling, testicular pain and urgency.   Musculoskeletal: Negative for arthralgias, back pain, gait problem, joint swelling, myalgias, neck pain and neck stiffness.   Skin: Positive for color change, rash (on dorsal aspect of left hand) and wound. Negative for pallor.   Allergic/Immunologic: Negative for environmental allergies, food allergies and immunocompromised state.   Neurological: Negative for dizziness, tremors, seizures, syncope, facial asymmetry, speech difficulty, weakness, light-headedness, numbness and headaches.   Hematological: Negative for adenopathy. Does not bruise/bleed easily.   Psychiatric/Behavioral: Negative for agitation, behavioral problems, confusion, decreased concentration, dysphoric mood, hallucinations, self-injury, sleep disturbance and suicidal ideas. The patient is not nervous/anxious and is not hyperactive.        Objective:    No intake/output data recorded.  No intake/output data recorded.  Patient Vitals for the past 8 hrs:   BP Temp Pulse Resp SpO2 Height Weight   08/15/19 1144 126/89 98.4 ??F (36.9 ??C) 82 16 100 % 5\' 9"  (1.753 m) 77.1 kg (170 lb)     Physical Exam  Constitutional:       General: He is not in acute distress.     Appearance: Normal appearance. He is normal weight. He is ill-appearing. He is not toxic-appearing.   HENT:      Head: Normocephalic and atraumatic.      Nose: Nose normal. No congestion.      Mouth/Throat:  Mouth: Mucous membranes are moist.      Pharynx: Oropharynx is clear. No oropharyngeal exudate or posterior oropharyngeal erythema.   Eyes:      General: No scleral icterus.        Right eye: No discharge.         Left eye: No discharge.      Extraocular  Movements: Extraocular movements intact.      Conjunctiva/sclera: Conjunctivae normal.      Pupils: Pupils are equal, round, and reactive to light.   Neck:      Musculoskeletal: Normal range of motion and neck supple. No neck rigidity or muscular tenderness.      Vascular: No carotid bruit.   Cardiovascular:      Rate and Rhythm: Normal rate and regular rhythm.      Pulses: Normal pulses.      Heart sounds: Normal heart sounds. No murmur. No friction rub. No gallop.    Pulmonary:      Effort: Pulmonary effort is normal. No respiratory distress.      Breath sounds: Normal breath sounds. No stridor. No wheezing, rhonchi or rales.   Chest:      Chest wall: No tenderness.   Abdominal:      General: Abdomen is flat. Bowel sounds are normal. There is no distension.      Palpations: Abdomen is soft. There is no mass.      Tenderness: There is no abdominal tenderness. There is no right CVA tenderness, left CVA tenderness, guarding or rebound.      Hernia: No hernia is present.   Musculoskeletal: Normal range of motion.         General: No swelling, tenderness, deformity or signs of injury.      Right lower leg: No edema.      Left lower leg: No edema.   Lymphadenopathy:      Cervical: No cervical adenopathy.   Skin:     General: Skin is warm.      Findings: Erythema (appreciable warmth erythema, swelling, tenderness on dorsal aspect of left hand) and rash present.   Neurological:      General: No focal deficit present.      Mental Status: He is alert and oriented to person, place, and time. Mental status is at baseline.      Cranial Nerves: No cranial nerve deficit.      Sensory: No sensory deficit.      Motor: No weakness.      Coordination: Coordination normal.      Gait: Gait normal.      Deep Tendon Reflexes: Reflexes normal.   Psychiatric:         Mood and Affect: Mood normal.         Behavior: Behavior normal.         Thought Content: Thought content normal.         Judgment: Judgment normal.          Labs:    Recent  Results (from the past 24 hour(s))   HGB & HCT    Collection Time: 08/15/19 12:15 PM   Result Value Ref Range    HGB 15.0 12.1 - 17.0 %    HCT 44.6 36.6 - 50.3 %   PROTHROMBIN TIME + INR    Collection Time: 08/15/19 12:15 PM   Result Value Ref Range    Prothrombin time 12.1 11.9 - 14.7 sec    INR 0.9 0.9 - 1.1  PTT    Collection Time: 08/15/19 12:15 PM   Result Value Ref Range    aPTT 31.6 23.0 - 35.7 sec    aPTT, therapeutic range   68 - 109 sec       ECG: N/A  Chest X-Ray: N/A    Assessment:  Active Problems:    Snake bite (08/15/2019)      Hypertension (08/15/2019)        Plan:    Snakebite??? patient presents with above-mentioned symptomatology consistent with a snakebite, unclear as to causative snake  Obtain blood cultures  Observe for further swelling as well as redness, observe for compartmental syndrome  Unasyn for antibiotic coverage    Hypertension???continue home medications    Prophylaxis??? Lovenox  FEN???cardiac diet, maintenance IV fluids, replete potassium and magnesium  Full code, admitted for observation  Signed:  Sewell Pitner Ned Card, MD 08/15/2019      Of note was notified by RN about significant worsening of LUE swelling, poison control contacted, recommended administration of anti-venom, discontinuation of Abx, and transfer to ICU for frequent monitoring. Orders placed and communicated with pharmacy and RN.

## 2019-08-15 NOTE — ED Provider Notes (Signed)
ED Provider Notes by Marcelyn BruinsZelensky, Shuayb Schepers K, MD at 08/15/19 1232                Author: Marcelyn BruinsZelensky, Brittne Kawasaki K, MD  Service: --  Author Type: Physician       Filed: 08/15/19 1449  Date of Service: 08/15/19 1232  Status: Signed          Editor: Marcelyn BruinsZelensky, Sierah Lacewell K, MD (Physician)               Patient is a 53 year old male, presented to the emergency department approximately 10 minutes after sustaining  a snakebite to his left hand.  Patient arrived to the emergency department approximately 11:30 AM.  Patient states he was golfing this morning reached into some grass to retrieve his ball, and there saw the head of a snake which bit him to his left middle  finger and proximal knuckle area.  Patient was not able to describe snake in any way.  Did not recall color size or shape.  Patient complains of pain swelling to left distal hand and middle knuckle area.  No numbness tingling no weakness patient denies  any chest pain, shortness of breath, abdominal pain.  Unknown last tetanus shot.                  Past Medical History:        Diagnosis  Date         ?  Hypertension             No past surgical history on file.        No family history on file.        Social History          Socioeconomic History         ?  Marital status:  MARRIED              Spouse name:  Not on file         ?  Number of children:  Not on file     ?  Years of education:  Not on file     ?  Highest education level:  Not on file       Occupational History        ?  Not on file       Social Needs         ?  Financial resource strain:  Not on file        ?  Food insecurity              Worry:  Not on file         Inability:  Not on file        ?  Transportation needs              Medical:  Not on file         Non-medical:  Not on file       Tobacco Use         ?  Smoking status:  Never Smoker     ?  Smokeless tobacco:  Never Used       Substance and Sexual Activity         ?  Alcohol use:  Not on file     ?  Drug use:  Not on file     ?  Sexual activity:   Not on file       Lifestyle        ?  Physical activity              Days per week:  Not on file         Minutes per session:  Not on file         ?  Stress:  Not on file       Relationships        ?  Social Health visitor on phone:  Not on file         Gets together:  Not on file         Attends religious service:  Not on file         Active member of club or organization:  Not on file         Attends meetings of clubs or organizations:  Not on file         Relationship status:  Not on file        ?  Intimate partner violence              Fear of current or ex partner:  Not on file         Emotionally abused:  Not on file         Physically abused:  Not on file         Forced sexual activity:  Not on file        Other Topics  Concern        ?  Not on file       Social History Narrative        ?  Not on file              ALLERGIES: Patient has no known allergies.      Review of Systems    Constitutional: Negative for activity change, appetite change, chills and fever.    Eyes: Negative for photophobia and visual disturbance.    Respiratory: Negative for cough, chest tightness and shortness of breath.     Cardiovascular: Negative for chest pain.    Gastrointestinal: Negative for abdominal pain, diarrhea, nausea and vomiting.    Musculoskeletal: Positive for myalgias. Negative for arthralgias and joint swelling.    Skin: Positive for color change.         Erythema to left dorsal distal third knuckle area    Allergic/Immunologic: Negative for environmental allergies and food allergies.    Neurological: Negative for dizziness, light-headedness, numbness and headaches.    Hematological: Negative for adenopathy. Does not bruise/bleed easily.            Vitals:          08/15/19 1144        BP:  126/89     Pulse:  82     Resp:  16     Temp:  98.4 ??F (36.9 ??C)     SpO2:  100%     Weight:  77.1 kg (170 lb)        Height:  5\' 9"  (1.753 m)                Physical Exam   Constitutional :        General: He is  not in acute distress.     Appearance: Normal appearance. He is not ill-appearing or diaphoretic.    HENT:       Head: Normocephalic and atraumatic.  Nose: Nose normal.      Mouth/Throat:      Mouth: Mucous membranes are moist.      Pharynx: Oropharynx is clear.   Eyes:       Extraocular Movements: Extraocular movements intact.      Conjunctiva/sclera: Conjunctivae normal.    Neck:       Musculoskeletal: Normal range of motion and neck supple.   Cardiovascular :       Rate and Rhythm: Normal rate and regular rhythm.      Pulses: Normal pulses.      Heart sounds: Normal heart sounds.    Pulmonary:       Effort: Pulmonary effort is normal.      Breath sounds: Normal breath sounds.   Abdominal :      General: Abdomen is flat. Bowel sounds are normal.      Palpations: Abdomen is soft.     Musculoskeletal: Normal range of motion.          General: Swelling and tenderness  present.        Hands:       Skin:      Capillary Refill: Capillary refill takes less than 2 seconds.      Findings: No bruising.    Neurological:       General: No focal deficit present.      Mental Status: He is alert and oriented to person, place, and time. Mental status is at baseline.             MDM       53 year old male, presents to the emergency department approximately 10 minutes after sustaining an snake bite from unknown species of snake while golfing this morning.  Complaining of pain swelling to bite site on left hand and swelling to left hand.      Plan:    Area washed thoroughly with soap and water.  [place] Poison control contacted, discussed case, recommendations include circumferential measurements of finger, bite site, hand and wrist, basic lab work drawn including CBC and coags, elevation of limb,  tetanus update, analgesia as needed.   - CBC, pt/ptt drawn, patient received tetanus update.    - Observe patient and will re-consult poison control following lab work resultant        Measurements as per nursing notes:       - 12:54  PM   Right wrist 6 1/2 inches, right knuckles 7 1/2 inches, right third finger 2 3/4 inches   Left wrist 6 inches, left knuckles 8 1/2inches, right third finger 3 inches            Recent Results (from the past 12 hour(s))     HGB & HCT          Collection Time: 08/15/19 12:15 PM         Result  Value  Ref Range            HGB  15.0  12.1 - 17.0 %       HCT  44.6  36.6 - 50.3 %       PROTHROMBIN TIME + INR          Collection Time: 08/15/19 12:15 PM         Result  Value  Ref Range            Prothrombin time  12.1  11.9 - 14.7 sec       INR  0.9  0.9 - 1.1  PTT          Collection Time: 08/15/19 12:15 PM         Result  Value  Ref Range            aPTT  31.6  23.0 - 35.7 sec            aPTT, therapeutic range     68 - 109 sec              Patients lab work resulted showing normal hemoglobin hematocrit and normal coags.   Reconsulted Poison [employer], at this time given normal blood work and minimal swelling do not recommend antivenin.  Do recommend that patient be admitted to observation for 24  hours.  Further recommendations include keep affected extremity elevated, emphasized no ice, no antibiotics.  Pain management as needed.      Discussed results and consultation with toxicology with patient, recommended admission for observation, patient agreed.  Complaining of increasing pain to area, will treat pain with morphine IV.         2:41 PM   Spoke with on-call hospitalist, will admit patient for observation.

## 2019-08-15 NOTE — Progress Notes (Signed)
Spoke with MD, he agreed to re-access patient for symptoms & coagulopathy after 1st dose of anti venom to determine if additional doses needed

## 2019-08-15 NOTE — ED Notes (Signed)
Per toxicology measurements were taken comparing puncture site ( left finger)  to unaffected extremity (right arm)  Right wrist 6 1/2 inches, right knuckles 7 1/2 inches, right third finger 2 3/4 inches  Left wrist 6 inches, left knuckles 8 1/2inches, right third finger 3 inches

## 2019-08-15 NOTE — ED Notes (Signed)
Pt to er with reported snake bite to left hand

## 2019-08-15 NOTE — ED Notes (Signed)
Patient was complaining of increasing pain to left hand/wrist area, while assessing snakebite noted to have increased swelling. Poison control was contacted and after remeasuring noted increased size. Finger 8cm, hand 22cm, wrist 19 cm. I marked around swelling area to measure per protocol.

## 2019-08-15 NOTE — H&P (Addendum)
GENERAL GENERIC H&P/CONSULT    Subjective: 53 year old male with past medical history of hypertension presents status post snakebite.  Patient states he was in his usual state of health until earlier this a.m. when he was bitten by a snake on his left hand following which he had significant swelling as well as pain and redness of his left hand following which patient presented to the ED.  Patient denies any fevers chills nausea vomiting lightheadedness dizziness dyspnea orthopnea paroxysmal nocturnal dyspnea chest pain palpitation's headache focal weakness loss of sensation auditory or visual symptom abdominal stool or urinary complaints or any other associated symptoms.  Endorses no recent sick contacts or travel activity  Past Medical History:   Diagnosis Date   ??? Hypertension       No past surgical history on file.   Prior to Admission medications    Not on File     No Known Allergies   Social History     Tobacco Use   ??? Smoking status: Never Smoker   ??? Smokeless tobacco: Never Used   Substance Use Topics   ??? Alcohol use: Not on file      No family history on file.   Review of Systems   Constitutional: Negative for activity change, appetite change, chills, diaphoresis, fatigue, fever and unexpected weight change.   HENT: Negative for congestion, dental problem, drooling, ear discharge, ear pain, facial swelling, hearing loss, mouth sores, nosebleeds, postnasal drip, rhinorrhea, sinus pressure, sinus pain, sneezing, sore throat, tinnitus, trouble swallowing and voice change.    Eyes: Negative for photophobia, pain, discharge, redness, itching and visual disturbance.   Respiratory: Negative for apnea, cough, choking, chest tightness, shortness of breath, wheezing and stridor.    Cardiovascular: Negative for chest pain, palpitations and leg swelling.   Gastrointestinal: Negative for abdominal distention, abdominal pain, anal bleeding, blood in stool, constipation, diarrhea, nausea, rectal pain and vomiting.    Endocrine: Negative for cold intolerance, heat intolerance, polydipsia, polyphagia and polyuria.   Genitourinary: Negative for decreased urine volume, difficulty urinating, discharge, dysuria, enuresis, flank pain, frequency, genital sores, hematuria, penile pain, penile swelling, scrotal swelling, testicular pain and urgency.   Musculoskeletal: Negative for arthralgias, back pain, gait problem, joint swelling, myalgias, neck pain and neck stiffness.   Skin: Positive for color change, rash (on dorsal aspect of left hand) and wound. Negative for pallor.   Allergic/Immunologic: Negative for environmental allergies, food allergies and immunocompromised state.   Neurological: Negative for dizziness, tremors, seizures, syncope, facial asymmetry, speech difficulty, weakness, light-headedness, numbness and headaches.   Hematological: Negative for adenopathy. Does not bruise/bleed easily.   Psychiatric/Behavioral: Negative for agitation, behavioral problems, confusion, decreased concentration, dysphoric mood, hallucinations, self-injury, sleep disturbance and suicidal ideas. The patient is not nervous/anxious and is not hyperactive.        Objective:    No intake/output data recorded.  No intake/output data recorded.  Patient Vitals for the past 8 hrs:   BP Temp Pulse Resp SpO2 Height Weight   08/15/19 1144 126/89 98.4 ??F (36.9 ??C) 82 16 100 % 5\' 9"  (1.753 m) 77.1 kg (170 lb)     Physical Exam  Constitutional:       General: He is not in acute distress.     Appearance: Normal appearance. He is normal weight. He is ill-appearing. He is not toxic-appearing.   HENT:      Head: Normocephalic and atraumatic.      Nose: Nose normal. No congestion.      Mouth/Throat:  Mouth: Mucous membranes are moist.      Pharynx: Oropharynx is clear. No oropharyngeal exudate or posterior oropharyngeal erythema.   Eyes:      General: No scleral icterus.        Right eye: No discharge.         Left eye: No discharge.       Extraocular Movements: Extraocular movements intact.      Conjunctiva/sclera: Conjunctivae normal.      Pupils: Pupils are equal, round, and reactive to light.   Neck:      Musculoskeletal: Normal range of motion and neck supple. No neck rigidity or muscular tenderness.      Vascular: No carotid bruit.   Cardiovascular:      Rate and Rhythm: Normal rate and regular rhythm.      Pulses: Normal pulses.      Heart sounds: Normal heart sounds. No murmur. No friction rub. No gallop.    Pulmonary:      Effort: Pulmonary effort is normal. No respiratory distress.      Breath sounds: Normal breath sounds. No stridor. No wheezing, rhonchi or rales.   Chest:      Chest wall: No tenderness.   Abdominal:      General: Abdomen is flat. Bowel sounds are normal. There is no distension.      Palpations: Abdomen is soft. There is no mass.      Tenderness: There is no abdominal tenderness. There is no right CVA tenderness, left CVA tenderness, guarding or rebound.      Hernia: No hernia is present.   Musculoskeletal: Normal range of motion.         General: No swelling, tenderness, deformity or signs of injury.      Right lower leg: No edema.      Left lower leg: No edema.   Lymphadenopathy:      Cervical: No cervical adenopathy.   Skin:     General: Skin is warm.      Findings: Erythema (appreciable warmth erythema, swelling, tenderness on dorsal aspect of left hand) and rash present.   Neurological:      General: No focal deficit present.      Mental Status: He is alert and oriented to person, place, and time. Mental status is at baseline.      Cranial Nerves: No cranial nerve deficit.      Sensory: No sensory deficit.      Motor: No weakness.      Coordination: Coordination normal.      Gait: Gait normal.      Deep Tendon Reflexes: Reflexes normal.   Psychiatric:         Mood and Affect: Mood normal.         Behavior: Behavior normal.         Thought Content: Thought content normal.         Judgment: Judgment normal.           Labs:    Recent Results (from the past 24 hour(s))   HGB & HCT    Collection Time: 08/15/19 12:15 PM   Result Value Ref Range    HGB 15.0 12.1 - 17.0 %    HCT 44.6 36.6 - 50.3 %   PROTHROMBIN TIME + INR    Collection Time: 08/15/19 12:15 PM   Result Value Ref Range    Prothrombin time 12.1 11.9 - 14.7 sec    INR 0.9 0.9 - 1.1  PTT    Collection Time: 08/15/19 12:15 PM   Result Value Ref Range    aPTT 31.6 23.0 - 35.7 sec    aPTT, therapeutic range   68 - 109 sec       ECG: N/A  Chest X-Ray: N/A    Assessment:  Active Problems:    Snake bite (08/15/2019)      Hypertension (08/15/2019)        Plan:    Snakebite??? patient presents with above-mentioned symptomatology consistent with a snakebite, unclear as to causative snake  Obtain blood cultures  Observe for further swelling as well as redness, observe for compartmental syndrome  Unasyn for antibiotic coverage    Hypertension???continue home medications    Prophylaxis??? Lovenox  FEN???cardiac diet, maintenance IV fluids, replete potassium and magnesium  Full code, admitted for observation  Signed:  Amberleigh Gerken Ned Card, MD 08/15/2019      Of note was notified by RN about significant worsening of LUE swelling, poison control contacted, recommended administration of anti-venom, discontinuation of Abx, and transfer to ICU for frequent monitoring. Orders placed and communicated with pharmacy and RN.

## 2019-08-15 NOTE — ED Notes (Signed)
Patient was complaining of increasing pain to left hand/wrist area, while assessing snakebite noted to have increased swelling. Poison control was contacted and after remeasuring noted increased size. Finger 8cm, hand 22cm, wrist 19 cm. I marked around swelling area to measure per protocol.

## 2019-08-15 NOTE — ED Triage Notes (Signed)
Pt to er with reported snake bite to left hand

## 2019-08-15 NOTE — Progress Notes (Signed)
Spoke with MD, he agreed to re-access patient for symptoms & coagulopathy after 1st dose of anti venom to determine if additional doses needed

## 2019-08-15 NOTE — ED Notes (Addendum)
Per toxicology measurements were taken comparing puncture site ( left finger)  to unaffected extremity (right arm)  Right wrist 6 1/2 inches, right knuckles 7 1/2 inches, right third finger 2 3/4 inches  Left wrist 6 inches, left knuckles 8 1/2inches, right third finger 3 inches

## 2019-08-15 NOTE — ED Provider Notes (Signed)
Patient is a 53 year old male, presented to the emergency department approximately 10 minutes after sustaining a snakebite to his left hand.  Patient arrived to the emergency department approximately 11:30 AM.  Patient states he was golfing this morning reached into some grass to retrieve his ball, and there saw the head of a snake which bit him to his left middle finger and proximal knuckle area.  Patient was not able to describe snake in any way.  Did not recall color size or shape.  Patient complains of pain swelling to left distal hand and middle knuckle area.  No numbness tingling no weakness patient denies any chest pain, shortness of breath, abdominal pain.  Unknown last tetanus shot.           Past Medical History:   Diagnosis Date   ??? Hypertension        No past surgical history on file.      No family history on file.    Social History     Socioeconomic History   ??? Marital status: MARRIED     Spouse name: Not on file   ??? Number of children: Not on file   ??? Years of education: Not on file   ??? Highest education level: Not on file   Occupational History   ??? Not on file   Social Needs   ??? Financial resource strain: Not on file   ??? Food insecurity     Worry: Not on file     Inability: Not on file   ??? Transportation needs     Medical: Not on file     Non-medical: Not on file   Tobacco Use   ??? Smoking status: Never Smoker   ??? Smokeless tobacco: Never Used   Substance and Sexual Activity   ??? Alcohol use: Not on file   ??? Drug use: Not on file   ??? Sexual activity: Not on file   Lifestyle   ??? Physical activity     Days per week: Not on file     Minutes per session: Not on file   ??? Stress: Not on file   Relationships   ??? Social Wellsite geologist on phone: Not on file     Gets together: Not on file     Attends religious service: Not on file     Active member of club or organization: Not on file     Attends meetings of clubs or organizations: Not on file     Relationship status: Not on file    ??? Intimate partner violence     Fear of current or ex partner: Not on file     Emotionally abused: Not on file     Physically abused: Not on file     Forced sexual activity: Not on file   Other Topics Concern   ??? Not on file   Social History Narrative   ??? Not on file         ALLERGIES: Patient has no known allergies.    Review of Systems   Constitutional: Negative for activity change, appetite change, chills and fever.   Eyes: Negative for photophobia and visual disturbance.   Respiratory: Negative for cough, chest tightness and shortness of breath.    Cardiovascular: Negative for chest pain.   Gastrointestinal: Negative for abdominal pain, diarrhea, nausea and vomiting.   Musculoskeletal: Positive for myalgias. Negative for arthralgias and joint swelling.   Skin: Positive for color change.  Erythema to left dorsal distal third knuckle area   Allergic/Immunologic: Negative for environmental allergies and food allergies.   Neurological: Negative for dizziness, light-headedness, numbness and headaches.   Hematological: Negative for adenopathy. Does not bruise/bleed easily.       Vitals:    08/15/19 1144   BP: 126/89   Pulse: 82   Resp: 16   Temp: 98.4 ??F (36.9 ??C)   SpO2: 100%   Weight: 77.1 kg (170 lb)   Height: 5\' 9"  (1.753 m)            Physical Exam  Constitutional:       General: He is not in acute distress.     Appearance: Normal appearance. He is not ill-appearing or diaphoretic.   HENT:      Head: Normocephalic and atraumatic.      Nose: Nose normal.      Mouth/Throat:      Mouth: Mucous membranes are moist.      Pharynx: Oropharynx is clear.   Eyes:      Extraocular Movements: Extraocular movements intact.      Conjunctiva/sclera: Conjunctivae normal.   Neck:      Musculoskeletal: Normal range of motion and neck supple.   Cardiovascular:      Rate and Rhythm: Normal rate and regular rhythm.      Pulses: Normal pulses.      Heart sounds: Normal heart sounds.   Pulmonary:       Effort: Pulmonary effort is normal.      Breath sounds: Normal breath sounds.   Abdominal:      General: Abdomen is flat. Bowel sounds are normal.      Palpations: Abdomen is soft.   Musculoskeletal: Normal range of motion.         General: Swelling and tenderness present.        Hands:    Skin:     Capillary Refill: Capillary refill takes less than 2 seconds.      Findings: No bruising.   Neurological:      General: No focal deficit present.      Mental Status: He is alert and oriented to person, place, and time. Mental status is at baseline.          MDM     53 year old male, presents to the emergency department approximately 10 minutes after sustaining an snake bite from unknown species of snake while golfing this morning.  Complaining of pain swelling to bite site on left hand and swelling to left hand.    Plan:   Area washed thoroughly with soap and water.  [place] Poison control contacted, discussed case, recommendations include circumferential measurements of finger, bite site, hand and wrist, basic lab work drawn including CBC and coags, elevation of limb, tetanus update, analgesia as needed.  - CBC, pt/ptt drawn, patient received tetanus update.   - Observe patient and will re-consult poison control following lab work resultant      Measurements as per nursing notes:     - 12:54 PM  Right wrist 6 1/2 inches, right knuckles 7 1/2 inches, right third finger 2 3/4 inches  Left wrist 6 inches, left knuckles 8 1/2inches, right third finger 3 inches       Recent Results (from the past 12 hour(s))   HGB & HCT    Collection Time: 08/15/19 12:15 PM   Result Value Ref Range    HGB 15.0 12.1 - 17.0 %    HCT 44.6 36.6 -  50.3 %   PROTHROMBIN TIME + INR    Collection Time: 08/15/19 12:15 PM   Result Value Ref Range    Prothrombin time 12.1 11.9 - 14.7 sec    INR 0.9 0.9 - 1.1     PTT    Collection Time: 08/15/19 12:15 PM   Result Value Ref Range    aPTT 31.6 23.0 - 35.7 sec    aPTT, therapeutic range   68 - 109 sec          Patients lab work resulted showing normal hemoglobin hematocrit and normal coags.  Reconsulted [facility], at this time given normal blood work and minimal swelling do not recommend antivenin.  Do recommend that patient be admitted to observation for 24 hours.  Further recommendations include keep affected extremity elevated, emphasized no ice, no antibiotics.  Pain management as needed.    Discussed results and consultation with toxicology with patient, recommended admission for observation, patient agreed.  Complaining of increasing pain to area, will treat pain with morphine IV.      2:41 PM  Spoke with on-call hospitalist, will admit patient for observation.

## 2019-08-16 LAB — CBC WITH AUTO DIFFERENTIAL
Basophils %: 0 % (ref 0–1)
Basophils Absolute: 0 10*3/uL (ref 0.0–0.1)
Eosinophils %: 2 % (ref 0–7)
Eosinophils Absolute: 0.1 10*3/uL (ref 0.0–0.4)
Granulocyte Absolute Count: 0 10*3/uL (ref 0.00–0.04)
Hematocrit: 48 % (ref 36.6–50.3)
Hemoglobin: 16 % (ref 12.1–17.0)
Immature Granulocytes: 0 % (ref 0.0–0.5)
Lymphocytes %: 23 % (ref 12–49)
Lymphocytes Absolute: 1.6 10*3/uL (ref 0.8–3.5)
MCH: 29.5 PG (ref 26.0–34.0)
MCHC: 33.3 g/dL (ref 30.0–36.5)
MCV: 88.6 FL (ref 80.0–99.0)
MPV: 11.9 FL (ref 8.9–12.9)
Monocytes %: 8 % (ref 5–13)
Monocytes Absolute: 0.5 10*3/uL (ref 0.0–1.0)
Neutrophils %: 67 % (ref 32–75)
Neutrophils Absolute: 4.8 10*3/uL (ref 1.8–8.0)
Platelets: 286 10*3/uL (ref 150–400)
RBC: 5.42 M/uL (ref 4.10–5.70)
RDW: 13.7 % (ref 11.5–14.5)
WBC: 7.1 10*3/uL (ref 4.1–11.1)

## 2019-08-16 LAB — CK W/ REFLX CKMB
CK: 1813.1 ng/mL — ABNORMAL HIGH (ref 39–308)
CK: 1813.1 ng/mL — ABNORMAL HIGH (ref 39–308)
CK: 1078 ng/mL — ABNORMAL HIGH (ref 39–308)
CK: 1078 ng/mL — ABNORMAL HIGH (ref 39–308)

## 2019-08-16 LAB — BASIC METABOLIC PANEL
Anion Gap: 8 mmol/L (ref 5–15)
Anion Gap: 6 mmol/L (ref 5–15)
Anion Gap: 5 mmol/L (ref 5–15)
BUN: 15 mg/dL (ref 6–20)
BUN: 14 mg/dL (ref 6–20)
BUN: 15 mg/dL (ref 6–20)
Bun/Cre Ratio: 11 — ABNORMAL LOW (ref 12–20)
Bun/Cre Ratio: 11 — ABNORMAL LOW (ref 12–20)
Bun/Cre Ratio: 11 — ABNORMAL LOW (ref 12–20)
CO2: 24 mmol/L (ref 21–32)
CO2: 31 mmol/L (ref 21–32)
CO2: 32 mmol/L (ref 21–32)
Calcium: 8.9 mg/dL (ref 8.5–10.1)
Calcium: 8.6 mg/dL (ref 8.5–10.1)
Calcium: 8.9 mg/dL (ref 8.5–10.1)
Chloride: 105 mmol/L (ref 97–108)
Chloride: 103 mmol/L (ref 97–108)
Chloride: 102 mmol/L (ref 97–108)
Creatinine: 1.42 mg/dL — ABNORMAL HIGH (ref 0.70–1.30)
Creatinine: 1.3 mg/dL (ref 0.70–1.30)
Creatinine: 1.4 mg/dL — ABNORMAL HIGH (ref 0.70–1.30)
EGFR IF NonAfrican American: 52 mL/min/{1.73_m2} — ABNORMAL LOW (ref >60)
EGFR IF NonAfrican American: 58 mL/min/{1.73_m2} — ABNORMAL LOW (ref >60)
EGFR IF NonAfrican American: 53 mL/min/{1.73_m2} — ABNORMAL LOW (ref >60)
GFR African American: >60 mL/min/{1.73_m2} (ref >60)
GFR African American: >60 mL/min/{1.73_m2} (ref >60)
GFR African American: >60 mL/min/{1.73_m2} (ref >60)
Glucose: 96 mg/dL (ref 65–100)
Glucose: 86 mg/dL (ref 65–100)
Glucose: 88 mg/dL (ref 65–100)
Potassium: 4.5 mmol/L (ref 3.5–5.1)
Potassium: 3.7 mmol/L (ref 3.5–5.1)
Potassium: 4 mmol/L (ref 3.5–5.1)
Sodium: 137 mmol/L (ref 136–145)
Sodium: 140 mmol/L (ref 136–145)
Sodium: 139 mmol/L (ref 136–145)

## 2019-08-16 LAB — COMPREHENSIVE METABOLIC PANEL
ALT: 55 U/L (ref 12–78)
AST: 59 U/L — ABNORMAL HIGH (ref 15–37)
Albumin/Globulin Ratio: 0.9 — ABNORMAL LOW (ref 1.1–2.2)
Albumin: 4 g/dL (ref 3.5–5.0)
Alkaline Phosphatase: 90 U/L (ref 45–117)
Anion Gap: 9 mmol/L (ref 5–15)
BUN: 14 mg/dL (ref 6–20)
Bun/Cre Ratio: 11 — ABNORMAL LOW (ref 12–20)
CO2: 26 mmol/L (ref 21–32)
Calcium: 9.2 mg/dL (ref 8.5–10.1)
Chloride: 103 mmol/L (ref 97–108)
Creatinine: 1.29 mg/dL (ref 0.70–1.30)
EGFR IF NonAfrican American: 58 mL/min/{1.73_m2} — ABNORMAL LOW (ref >60)
GFR African American: >60 mL/min/{1.73_m2} (ref >60)
Globulin: 4.3 g/dL — ABNORMAL HIGH (ref 2.0–4.0)
Glucose: 77 mg/dL (ref 65–100)
Potassium: 4.2 mmol/L (ref 3.5–5.1)
Sodium: 138 mmol/L (ref 136–145)
Total Bilirubin: 0.5 mg/dL (ref 0.2–1.0)
Total Protein: 8.3 g/dL — ABNORMAL HIGH (ref 6.4–8.2)

## 2019-08-16 LAB — MAGNESIUM
Magnesium: 2.1 mg/dL (ref 1.6–2.4)
Magnesium: 2.1 mg/dL (ref 1.6–2.4)

## 2019-08-16 LAB — FIBRINOGEN
Fibrinogen: 299 mg/dL (ref 220–535)
Fibrinogen: 299 mg/dL (ref 220–535)

## 2019-08-16 LAB — CK-MB,QUANT.
CK - MB: 6.3 ng/mL — ABNORMAL HIGH (ref <3.6)
CK - MB: 3.1 ng/mL (ref <3.6)
CK-MB Index: 0.3
CK-MB Index: 0.3
CK-MB Index: 0.3
CK-MB Index: 0.3
CK-MB: 6.3 ng/mL — ABNORMAL HIGH (ref <3.6)
CK-MB: 3.1 ng/mL (ref <3.6)

## 2019-08-16 LAB — METABOLIC PANEL, BASIC
Anion gap: 8 mmol/L (ref 5–15)
Anion gap: 6 mmol/L (ref 5–15)
Anion gap: 5 mmol/L (ref 5–15)
BUN/Creatinine ratio: 11 — ABNORMAL LOW (ref 12–20)
BUN/Creatinine ratio: 11 — ABNORMAL LOW (ref 12–20)
BUN/Creatinine ratio: 11 — ABNORMAL LOW (ref 12–20)
BUN: 15 mg/dL (ref 6–20)
BUN: 14 mg/dL (ref 6–20)
BUN: 15 mg/dL (ref 6–20)
CO2: 24 mmol/L (ref 21–32)
CO2: 31 mmol/L (ref 21–32)
CO2: 32 mmol/L (ref 21–32)
Calcium: 8.9 mg/dL (ref 8.5–10.1)
Calcium: 8.6 mg/dL (ref 8.5–10.1)
Calcium: 8.9 mg/dL (ref 8.5–10.1)
Chloride: 105 mmol/L (ref 97–108)
Chloride: 103 mmol/L (ref 97–108)
Chloride: 102 mmol/L (ref 97–108)
Creatinine: 1.42 mg/dL — ABNORMAL HIGH (ref 0.70–1.30)
Creatinine: 1.3 mg/dL (ref 0.70–1.30)
Creatinine: 1.4 mg/dL — ABNORMAL HIGH (ref 0.70–1.30)
GFR est AA: >60 mL/min/{1.73_m2} (ref >60)
GFR est AA: >60 mL/min/{1.73_m2} (ref >60)
GFR est AA: >60 mL/min/{1.73_m2} (ref >60)
GFR est non-AA: 52 mL/min/{1.73_m2} — ABNORMAL LOW (ref >60)
GFR est non-AA: 58 mL/min/{1.73_m2} — ABNORMAL LOW (ref >60)
GFR est non-AA: 53 mL/min/{1.73_m2} — ABNORMAL LOW (ref >60)
Glucose: 96 mg/dL (ref 65–100)
Glucose: 86 mg/dL (ref 65–100)
Glucose: 88 mg/dL (ref 65–100)
Potassium: 4.5 mmol/L (ref 3.5–5.1)
Potassium: 3.7 mmol/L (ref 3.5–5.1)
Potassium: 4 mmol/L (ref 3.5–5.1)
Sodium: 137 mmol/L (ref 136–145)
Sodium: 140 mmol/L (ref 136–145)
Sodium: 139 mmol/L (ref 136–145)

## 2019-08-16 LAB — CBC WITH AUTOMATED DIFF
ABS. BASOPHILS: 0 10*3/uL (ref 0.0–0.1)
ABS. EOSINOPHILS: 0.1 10*3/uL (ref 0.0–0.4)
ABS. IMM. GRANS.: 0 10*3/uL (ref 0.00–0.04)
ABS. LYMPHOCYTES: 1.6 10*3/uL (ref 0.8–3.5)
ABS. MONOCYTES: 0.5 10*3/uL (ref 0.0–1.0)
ABS. NEUTROPHILS: 4.8 10*3/uL (ref 1.8–8.0)
BASOPHILS: 0 % (ref 0–1)
EOSINOPHILS: 2 % (ref 0–7)
HCT: 48 % (ref 36.6–50.3)
HGB: 16 % (ref 12.1–17.0)
IMMATURE GRANULOCYTES: 0 % (ref 0.0–0.5)
LYMPHOCYTES: 23 % (ref 12–49)
MCH: 29.5 PG (ref 26.0–34.0)
MCHC: 33.3 g/dL (ref 30.0–36.5)
MCV: 88.6 FL (ref 80.0–99.0)
MONOCYTES: 8 % (ref 5–13)
MPV: 11.9 FL (ref 8.9–12.9)
NEUTROPHILS: 67 % (ref 32–75)
PLATELET: 286 10*3/uL (ref 150–400)
RBC: 5.42 M/uL (ref 4.10–5.70)
RDW: 13.7 % (ref 11.5–14.5)
WBC: 7.1 10*3/uL (ref 4.1–11.1)

## 2019-08-16 LAB — METABOLIC PANEL, COMPREHENSIVE
A-G Ratio: 0.9 — ABNORMAL LOW (ref 1.1–2.2)
ALT (SGPT): 55 U/L (ref 12–78)
AST (SGOT): 59 U/L — ABNORMAL HIGH (ref 15–37)
Albumin: 4 g/dL (ref 3.5–5.0)
Alk. phosphatase: 90 U/L (ref 45–117)
Anion gap: 9 mmol/L (ref 5–15)
BUN/Creatinine ratio: 11 — ABNORMAL LOW (ref 12–20)
BUN: 14 mg/dL (ref 6–20)
Bilirubin, total: 0.5 mg/dL (ref 0.2–1.0)
CO2: 26 mmol/L (ref 21–32)
Calcium: 9.2 mg/dL (ref 8.5–10.1)
Chloride: 103 mmol/L (ref 97–108)
Creatinine: 1.29 mg/dL (ref 0.70–1.30)
GFR est AA: >60 mL/min/{1.73_m2} (ref >60)
GFR est non-AA: 58 mL/min/{1.73_m2} — ABNORMAL LOW (ref >60)
Globulin: 4.3 g/dL — ABNORMAL HIGH (ref 2.0–4.0)
Glucose: 77 mg/dL (ref 65–100)
Potassium: 4.2 mmol/L (ref 3.5–5.1)
Protein, total: 8.3 g/dL — ABNORMAL HIGH (ref 6.4–8.2)
Sodium: 138 mmol/L (ref 136–145)

## 2019-08-16 MED ORDER — SODIUM CHLORIDE 0.9 % IV
INTRAVENOUS | Status: DC
Start: 2019-08-16 — End: 2019-08-17
  Administered 2019-08-16 – 2019-08-17 (×3): via INTRAVENOUS

## 2019-08-16 MED ORDER — BUTALBITAL-ACETAMINOPHEN-CAFFEINE 50 MG-325 MG-40 MG TAB
50-325-40 mg | ORAL | Status: DC | PRN
Start: 2019-08-16 — End: 2019-08-17
  Administered 2019-08-16: 21:00:00 via ORAL

## 2019-08-16 MED ORDER — SODIUM CHLORIDE 0.9% BOLUS IV
0.9 % | Freq: Once | INTRAVENOUS | Status: AC
Start: 2019-08-16 — End: 2019-08-16
  Administered 2019-08-16: 13:00:00 via INTRAVENOUS

## 2019-08-16 MED ORDER — HEPARIN, PORCINE (PF) 5,000 UNIT/0.5 ML SYRINGE
5000 unit/0.5 mL | Freq: Two times a day (BID) | INTRAMUSCULAR | Status: DC
Start: 2019-08-16 — End: 2019-08-17

## 2019-08-16 MED ORDER — SODIUM CHLORIDE 0.9% BOLUS IV
0.9 % | Freq: Once | INTRAVENOUS | Status: AC
Start: 2019-08-16 — End: 2019-08-16
  Administered 2019-08-16: 15:00:00 via INTRAVENOUS

## 2019-08-16 MED ORDER — HYDROCODONE-ACETAMINOPHEN 5 MG-325 MG TAB
5-325 mg | ORAL | Status: DC | PRN
Start: 2019-08-16 — End: 2019-08-17
  Administered 2019-08-16: 15:00:00 via ORAL

## 2019-08-16 MED FILL — BUTALBITAL-ACETAMINOPHEN-CAFFEINE 50 MG-325 MG-40 MG TAB: 50-325-40 mg | ORAL | Qty: 1

## 2019-08-16 MED FILL — METOPROLOL SUCCINATE SR 25 MG 24 HR TAB: 25 mg | ORAL | Qty: 1

## 2019-08-16 MED FILL — SODIUM CHLORIDE 0.9 % IV: INTRAVENOUS | Qty: 1000

## 2019-08-16 MED FILL — HYDROCODONE-ACETAMINOPHEN 5 MG-325 MG TAB: 5-325 mg | ORAL | Qty: 1

## 2019-08-16 MED FILL — TYLENOL 325 MG TABLET: 325 mg | ORAL | Qty: 2

## 2019-08-16 NOTE — ED Notes (Signed)
Left arm elevated.

## 2019-08-16 NOTE — ED Notes (Signed)
Carolyn at poison control called and updated with snakebite assessment

## 2019-08-16 NOTE — ED Notes (Signed)
Nurse called poison control to get insight on pt treatment. Nurse informed Jonathan Decker that CK was elevated. She stated start fluids and do a CK level Q6 hours until CK normal. Nurse paged Dr. Carloyn Jaeger and left message.

## 2019-08-16 NOTE — Progress Notes (Signed)
Hospitalist Progress Note    Subjective:   Daily Progress Note: 08/16/2019 8:32 AM    Hospital Course: Patient is a 53 yo male that presented to the ER on 08/15/2019 after sustaining a snake bite to left hand while playing golf. CK elevated at 1813.1. Poision control was notified. Recommends to cycle CK and start IV fluids. He was given the antivenom Crofab. Overnight the edema and redness in his left hand has increased to elbow.     Subjective: Patient currently denies any pain but states he just received morphine. Says the swelling and redness has increased     Current Facility-Administered Medications   Medication Dose Route Frequency   ??? 0.9% sodium chloride infusion  150 mL/hr IntraVENous CONTINUOUS   ??? sodium chloride 0.9 % bolus infusion 1,000 mL  1,000 mL IntraVENous ONCE    Followed by   ??? sodium chloride 0.9 % bolus infusion 1,000 mL  1,000 mL IntraVENous ONCE   ??? HYDROcodone-acetaminophen (NORCO) 5-325 mg per tablet 1 Tab  1 Tab Oral Q4H PRN   ??? metoprolol succinate (TOPROL-XL) XL tablet 25 mg  25 mg Oral DAILY   ??? sodium chloride (NS) flush 5-40 mL  5-40 mL IntraVENous Q8H   ??? sodium chloride (NS) flush 5-40 mL  5-40 mL IntraVENous PRN   ??? acetaminophen (TYLENOL) tablet 650 mg  650 mg Oral Q6H PRN    Or   ??? acetaminophen (TYLENOL) suppository 650 mg  650 mg Rectal Q6H PRN   ??? polyethylene glycol (MIRALAX) packet 17 g  17 g Oral DAILY PRN   ??? promethazine (PHENERGAN) tablet 12.5 mg  12.5 mg Oral Q6H PRN    Or   ??? ondansetron (ZOFRAN) injection 4 mg  4 mg IntraVENous Q6H PRN   ??? enoxaparin (LOVENOX) injection 40 mg  40 mg SubCUTAneous DAILY   ??? morphine injection 2 mg  2 mg IntraVENous Q4H PRN   ??? crotalidae polyval immune fab (CROFAB) 4 Vial in 0.9% sodium chloride IVPB  4 Vial IntraVENous Q6H PRN        Review of Systems  Constitutional: No fevers, No chills, No sweats, No fatigue, No Weakness  Eyes: No redness  Ears, nose, mouth, throat, and face: No nasal congestion, No sore throat, No voice  change  Respiratory: No Shortness of Breath, No cough, No wheezing  Cardiovascular: No chest pain, No palpitations, No extremity edema  Gastrointestinal: No nausea, No vomiting, No diarrhea, No abdominal pain  Genitourinary: No frequency, No dysuria, No hematuria  Integument/breast: No skin lesion(s)   Neurological: No Confusion, No headaches, No dizziness      Objective:     Visit Vitals  BP (!) 122/90 (BP 1 Location: Right arm, BP Patient Position: At rest;Head of bed elevated (Comment degrees))   Pulse 64   Temp 98 ??F (36.7 ??C)   Resp 24   Ht '5\' 9"'  (1.753 m)   Wt 77.9 kg (171 lb 11.8 oz)   SpO2 97%   BMI 25.36 kg/m??      O2 Device: Room air    Temp (24hrs), Avg:98.5 ??F (36.9 ??C), Min:98 ??F (36.7 ??C), Max:98.9 ??F (37.2 ??C)      No intake/output data recorded.  No intake/output data recorded.    PHYSICAL EXAM:  Constitutional: No acute distress  Skin: Extremities and face reveal no rashes. Left hand swollen with good ROM, warm, erythematous which has increased to elbow since last night.   HEENT: Sclerae anicteric. Extra-occular muscles are intact.  The neck is supple and no masses.   Cardiovascular: Regular rate and rhythm.   Respiratory:  Clear breath sounds bilaterally with no wheezes, rales, or rhonchi.   GI: Abdomen nondistended, soft, and nontender. Normal active bowel sounds.  Musculoskeletal: No pitting edema of the lower legs. Able to move all ext  Neurological:  Patient is alert and oriented. Cranial nerves II-XII grossly intact  Psychiatric: Mood appears appropriate       Data Review    Recent Results (from the past 24 hour(s))   HGB & HCT    Collection Time: 08/15/19 12:15 PM   Result Value Ref Range    HGB 15.0 12.1 - 17.0 %    HCT 44.6 36.6 - 50.3 %   PROTHROMBIN TIME + INR    Collection Time: 08/15/19 12:15 PM   Result Value Ref Range    Prothrombin time 12.1 11.9 - 14.7 sec    INR 0.9 0.9 - 1.1     PTT    Collection Time: 08/15/19 12:15 PM   Result Value Ref Range    aPTT 31.6 23.0 - 35.7 sec    aPTT,  therapeutic range   68 - 109 sec   CBC WITH AUTOMATED DIFF    Collection Time: 08/15/19 12:15 PM   Result Value Ref Range    WBC 6.4 4.1 - 11.1 K/uL    RBC 5.01 4.10 - 5.70 M/uL    HGB 15.2 12.1 - 17.0 %    HCT 43.9 36.6 - 50.3 %    MCV 87.6 80.0 - 99.0 FL    MCH 30.3 26.0 - 34.0 PG    MCHC 34.6 30.0 - 36.5 g/dL    RDW 14.4 11.5 - 14.5 %    PLATELET 336 150 - 400 K/uL    MPV 11.5 8.9 - 12.9 FL    NEUTROPHILS 62 32 - 75 %    LYMPHOCYTES 29 12 - 49 %    MONOCYTES 7 5 - 13 %    EOSINOPHILS 2 0 - 7 %    BASOPHILS 0 0 - 1 %    IMMATURE GRANULOCYTES 0 0.0 - 0.5 %    ABS. NEUTROPHILS 4.0 1.8 - 8.0 K/UL    ABS. LYMPHOCYTES 1.8 0.8 - 3.5 K/UL    ABS. MONOCYTES 0.4 0.0 - 1.0 K/UL    ABS. EOSINOPHILS 0.1 0.0 - 0.4 K/UL    ABS. BASOPHILS 0.0 0.0 - 0.1 K/UL    ABS. IMM. GRANS. 0.0 0.00 - 0.04 K/UL    DF AUTOMATED     METABOLIC PANEL, BASIC    Collection Time: 08/15/19 12:15 PM   Result Value Ref Range    Sodium 137 136 - 145 mmol/L    Potassium 4.5 3.5 - 5.1 mmol/L    Chloride 105 97 - 108 mmol/L    CO2 24 21 - 32 mmol/L    Anion gap 8 5 - 15 mmol/L    Glucose 96 65 - 100 mg/dL    BUN 15 6 - 20 mg/dL    Creatinine 1.42 (H) 0.70 - 1.30 mg/dL    BUN/Creatinine ratio 11 (L) 12 - 20      GFR est AA >60 >60 ml/min/1.63m    GFR est non-AA 52 (L) >60 ml/min/1.783m   Calcium 8.9 8.5 - 10.1 mg/dL   FIBRINOGEN    Collection Time: 08/15/19  6:45 PM   Result Value Ref Range    Fibrinogen 299 220 - 535 mg/dL  CK W/ REFLX CKMB    Collection Time: 08/15/19  6:45 PM   Result Value Ref Range    CK 1,813.1 (H) 39 - 053 ng/mL   METABOLIC PANEL, COMPREHENSIVE    Collection Time: 08/15/19  6:45 PM   Result Value Ref Range    Sodium 138 136 - 145 mmol/L    Potassium 4.2 3.5 - 5.1 mmol/L    Chloride 103 97 - 108 mmol/L    CO2 26 21 - 32 mmol/L    Anion gap 9 5 - 15 mmol/L    Glucose 77 65 - 100 mg/dL    BUN 14 6 - 20 mg/dL    Creatinine 1.29 0.70 - 1.30 mg/dL    BUN/Creatinine ratio 11 (L) 12 - 20      GFR est AA >60 >60 ml/min/1.39m    GFR est  non-AA 58 (L) >60 ml/min/1.757m   Calcium 9.2 8.5 - 10.1 mg/dL    Bilirubin, total 0.5 0.2 - 1.0 mg/dL    AST (SGOT) 59 (H) 15 - 37 U/L    ALT (SGPT) 55 12 - 78 U/L    Alk. phosphatase 90 45 - 117 U/L    Protein, total 8.3 (H) 6.4 - 8.2 g/dL    Albumin 4.0 3.5 - 5.0 g/dL    Globulin 4.3 (H) 2.0 - 4.0 g/dL    A-G Ratio 0.9 (L) 1.1 - 2.2     CK-MB,QUANT.    Collection Time: 08/15/19  6:45 PM   Result Value Ref Range    CK - MB 6.3 (H) <3.6 ng/mL    CK-MB Index 0.3     METABOLIC PANEL, BASIC    Collection Time: 08/16/19 12:29 AM   Result Value Ref Range    Sodium 140 136 - 145 mmol/L    Potassium 3.7 3.5 - 5.1 mmol/L    Chloride 103 97 - 108 mmol/L    CO2 31 21 - 32 mmol/L    Anion gap 6 5 - 15 mmol/L    Glucose 86 65 - 100 mg/dL    BUN 14 6 - 20 mg/dL    Creatinine 1.30 0.70 - 1.30 mg/dL    BUN/Creatinine ratio 11 (L) 12 - 20      GFR est AA >60 >60 ml/min/1.7335m  GFR est non-AA 58 (L) >60 ml/min/1.22m42m Calcium 8.6 8.5 - 10.197.6dL   METABOLIC PANEL, BASIC    Collection Time: 08/16/19  3:42 AM   Result Value Ref Range    Sodium 139 136 - 145 mmol/L    Potassium 4.0 3.5 - 5.1 mmol/L    Chloride 102 97 - 108 mmol/L    CO2 32 21 - 32 mmol/L    Anion gap 5 5 - 15 mmol/L    Glucose 88 65 - 100 mg/dL    BUN 15 6 - 20 mg/dL    Creatinine 1.40 (H) 0.70 - 1.30 mg/dL    BUN/Creatinine ratio 11 (L) 12 - 20      GFR est AA >60 >60 ml/min/1.22m220mGFR est non-AA 53 (L) >60 ml/min/1.22m2 65malcium 8.9 8.5 - 10.1 mg/dL   MAGNESIUM    Collection Time: 08/16/19  3:42 AM   Result Value Ref Range    Magnesium 2.1 1.6 - 2.4 mg/dL   CBC WITH AUTOMATED DIFF    Collection Time: 08/16/19  3:42 AM   Result Value Ref Range    WBC  7.1 4.1 - 11.1 K/uL    RBC 5.42 4.10 - 5.70 M/uL    HGB 16.0 12.1 - 17.0 %    HCT 48.0 36.6 - 50.3 %    MCV 88.6 80.0 - 99.0 FL    MCH 29.5 26.0 - 34.0 PG    MCHC 33.3 30.0 - 36.5 g/dL    RDW 13.7 11.5 - 14.5 %    PLATELET 286 150 - 400 K/uL    MPV 11.9 8.9 - 12.9 FL    NEUTROPHILS 67 32 - 75 %    LYMPHOCYTES  23 12 - 49 %    MONOCYTES 8 5 - 13 %    EOSINOPHILS 2 0 - 7 %    BASOPHILS 0 0 - 1 %    IMMATURE GRANULOCYTES 0 0.0 - 0.5 %    ABS. NEUTROPHILS 4.8 1.8 - 8.0 K/UL    ABS. LYMPHOCYTES 1.6 0.8 - 3.5 K/UL    ABS. MONOCYTES 0.5 0.0 - 1.0 K/UL    ABS. EOSINOPHILS 0.1 0.0 - 0.4 K/UL    ABS. BASOPHILS 0.0 0.0 - 0.1 K/UL    ABS. IMM. GRANS. 0.0 0.00 - 0.04 K/UL    DF AUTOMATED         CBC:   Lab Results   Component Value Date/Time    WBC 7.1 08/16/2019 03:42 AM    RBC 5.42 08/16/2019 03:42 AM    HGB 16.0 08/16/2019 03:42 AM    HCT 48.0 08/16/2019 03:42 AM    PLATELET 286 08/16/2019 03:42 AM     BMP:   Lab Results   Component Value Date/Time    Glucose 88 08/16/2019 03:42 AM    Sodium 139 08/16/2019 03:42 AM    Potassium 4.0 08/16/2019 03:42 AM    Chloride 102 08/16/2019 03:42 AM    CO2 32 08/16/2019 03:42 AM    BUN 15 08/16/2019 03:42 AM    Creatinine 1.40 (H) 08/16/2019 03:42 AM    Calcium 8.9 08/16/2019 03:42 AM     Coagulation:   Lab Results   Component Value Date/Time    Prothrombin time 12.1 08/15/2019 12:15 PM    INR 0.9 08/15/2019 12:15 PM    aPTT 31.6 08/15/2019 12:15 PM       Assessment/Plan:     1. Snake Bite  Cycle CK Q8H  Poison control notified  Start IV fluids  Received Crofab antivenom.   Afebrile and WBC WNL.  If continues to worsen may consider ID consult    2. HTN  BP is stable  Continue with metoprolol    Full Code  Heparin for DVT phx.     Care Plan discussed with: Patient/Family and Nurse and attending Dr. Barbaraann Barthel    Total time spent with patient: 67mnutes.

## 2019-08-16 NOTE — Other (Signed)
.  TRANSFER - OUT REPORT:    Verbal report given to cora M(name) on Jonathan Decker  being transferred to ICU(unit) for routine progression of care       Report consisted of patient???s Situation, Background, Assessment and   Recommendations(SBAR).     Information from the following report(s) SBAR, ED Summary and MAR was reviewed with the receiving nurse.    Lines:   Peripheral IV 08/15/19 Posterior;Right Hand (Active)       Peripheral IV 08/15/19 Anterior;Right Forearm (Active)        Opportunity for questions and clarification was provided.      Patient transported with:   Registered Nurse

## 2019-08-16 NOTE — Other (Signed)
Report given to incoming shift nurse Gillis Ends RN

## 2019-08-16 NOTE — ED Notes (Signed)
Left arm elevated

## 2019-08-16 NOTE — ED Notes (Signed)
Nurse called poison control to get insight on pt treatment. Nurse informed Carolyn that CK was elevated. She stated start fluids and do a CK level Q6 hours until CK normal. Nurse paged Dr. Qiayum and left message.

## 2019-08-16 NOTE — ED Notes (Signed)
Carolyn at poison control called and updated with snakebite assessment

## 2019-08-16 NOTE — Progress Notes (Signed)
Hospitalist Progress Note    Subjective:   Daily Progress Note: 08/16/2019 8:32 AM    Hospital Course: Patient is a 53 yo male that presented to the ER on 08/15/2019 after sustaining a snake bite to left hand while playing golf. CK elevated at 1813.1. Poision control was notified. Recommends to cycle CK and start IV fluids. He was given the antivenom Crofab. Overnight the edema and redness in his left hand has increased to elbow.     Subjective: Patient currently denies any pain but states he just received morphine. Says the swelling and redness has increased     Current Facility-Administered Medications   Medication Dose Route Frequency   ??? 0.9% sodium chloride infusion  150 mL/hr IntraVENous CONTINUOUS   ??? sodium chloride 0.9 % bolus infusion 1,000 mL  1,000 mL IntraVENous ONCE    Followed by   ??? sodium chloride 0.9 % bolus infusion 1,000 mL  1,000 mL IntraVENous ONCE   ??? HYDROcodone-acetaminophen (NORCO) 5-325 mg per tablet 1 Tab  1 Tab Oral Q4H PRN   ??? metoprolol succinate (TOPROL-XL) XL tablet 25 mg  25 mg Oral DAILY   ??? sodium chloride (NS) flush 5-40 mL  5-40 mL IntraVENous Q8H   ??? sodium chloride (NS) flush 5-40 mL  5-40 mL IntraVENous PRN   ??? acetaminophen (TYLENOL) tablet 650 mg  650 mg Oral Q6H PRN    Or   ??? acetaminophen (TYLENOL) suppository 650 mg  650 mg Rectal Q6H PRN   ??? polyethylene glycol (MIRALAX) packet 17 g  17 g Oral DAILY PRN   ??? promethazine (PHENERGAN) tablet 12.5 mg  12.5 mg Oral Q6H PRN    Or   ??? ondansetron (ZOFRAN) injection 4 mg  4 mg IntraVENous Q6H PRN   ??? enoxaparin (LOVENOX) injection 40 mg  40 mg SubCUTAneous DAILY   ??? morphine injection 2 mg  2 mg IntraVENous Q4H PRN   ??? crotalidae polyval immune fab (CROFAB) 4 Vial in 0.9% sodium chloride IVPB  4 Vial IntraVENous Q6H PRN        Review of Systems  Constitutional: No fevers, No chills, No sweats, No fatigue, No Weakness  Eyes: No redness  Ears, nose, mouth, throat, and face: No nasal congestion, No sore throat, No voice change   Respiratory: No Shortness of Breath, No cough, No wheezing  Cardiovascular: No chest pain, No palpitations, No extremity edema  Gastrointestinal: No nausea, No vomiting, No diarrhea, No abdominal pain  Genitourinary: No frequency, No dysuria, No hematuria  Integument/breast: No skin lesion(s)   Neurological: No Confusion, No headaches, No dizziness      Objective:     Visit Vitals  BP (!) 122/90 (BP 1 Location: Right arm, BP Patient Position: At rest;Head of bed elevated (Comment degrees))   Pulse 64   Temp 98 ??F (36.7 ??C)   Resp 24   Ht '5\' 9"'  (1.753 m)   Wt 77.9 kg (171 lb 11.8 oz)   SpO2 97%   BMI 25.36 kg/m??      O2 Device: Room air    Temp (24hrs), Avg:98.5 ??F (36.9 ??C), Min:98 ??F (36.7 ??C), Max:98.9 ??F (37.2 ??C)      No intake/output data recorded.  No intake/output data recorded.    PHYSICAL EXAM:  Constitutional: No acute distress  Skin: Extremities and face reveal no rashes. Left hand swollen with good ROM, warm, erythematous which has increased to elbow since last night.   HEENT: Sclerae anicteric. Extra-occular muscles are intact.  The neck is supple and no masses.   Cardiovascular: Regular rate and rhythm.   Respiratory:  Clear breath sounds bilaterally with no wheezes, rales, or rhonchi.   GI: Abdomen nondistended, soft, and nontender. Normal active bowel sounds.  Musculoskeletal: No pitting edema of the lower legs. Able to move all ext  Neurological:  Patient is alert and oriented. Cranial nerves II-XII grossly intact  Psychiatric: Mood appears appropriate       Data Review    Recent Results (from the past 24 hour(s))   HGB & HCT    Collection Time: 08/15/19 12:15 PM   Result Value Ref Range    HGB 15.0 12.1 - 17.0 %    HCT 44.6 36.6 - 50.3 %   PROTHROMBIN TIME + INR    Collection Time: 08/15/19 12:15 PM   Result Value Ref Range    Prothrombin time 12.1 11.9 - 14.7 sec    INR 0.9 0.9 - 1.1     PTT    Collection Time: 08/15/19 12:15 PM   Result Value Ref Range    aPTT 31.6 23.0 - 35.7 sec     aPTT, therapeutic range   68 - 109 sec   CBC WITH AUTOMATED DIFF    Collection Time: 08/15/19 12:15 PM   Result Value Ref Range    WBC 6.4 4.1 - 11.1 K/uL    RBC 5.01 4.10 - 5.70 M/uL    HGB 15.2 12.1 - 17.0 %    HCT 43.9 36.6 - 50.3 %    MCV 87.6 80.0 - 99.0 FL    MCH 30.3 26.0 - 34.0 PG    MCHC 34.6 30.0 - 36.5 g/dL    RDW 14.4 11.5 - 14.5 %    PLATELET 336 150 - 400 K/uL    MPV 11.5 8.9 - 12.9 FL    NEUTROPHILS 62 32 - 75 %    LYMPHOCYTES 29 12 - 49 %    MONOCYTES 7 5 - 13 %    EOSINOPHILS 2 0 - 7 %    BASOPHILS 0 0 - 1 %    IMMATURE GRANULOCYTES 0 0.0 - 0.5 %    ABS. NEUTROPHILS 4.0 1.8 - 8.0 K/UL    ABS. LYMPHOCYTES 1.8 0.8 - 3.5 K/UL    ABS. MONOCYTES 0.4 0.0 - 1.0 K/UL    ABS. EOSINOPHILS 0.1 0.0 - 0.4 K/UL    ABS. BASOPHILS 0.0 0.0 - 0.1 K/UL    ABS. IMM. GRANS. 0.0 0.00 - 0.04 K/UL    DF AUTOMATED     METABOLIC PANEL, BASIC    Collection Time: 08/15/19 12:15 PM   Result Value Ref Range    Sodium 137 136 - 145 mmol/L    Potassium 4.5 3.5 - 5.1 mmol/L    Chloride 105 97 - 108 mmol/L    CO2 24 21 - 32 mmol/L    Anion gap 8 5 - 15 mmol/L    Glucose 96 65 - 100 mg/dL    BUN 15 6 - 20 mg/dL    Creatinine 1.42 (H) 0.70 - 1.30 mg/dL    BUN/Creatinine ratio 11 (L) 12 - 20      GFR est AA >60 >60 ml/min/1.56m    GFR est non-AA 52 (L) >60 ml/min/1.728m   Calcium 8.9 8.5 - 10.1 mg/dL   FIBRINOGEN    Collection Time: 08/15/19  6:45 PM   Result Value Ref Range    Fibrinogen 299 220 - 535 mg/dL  CK W/ REFLX CKMB    Collection Time: 08/15/19  6:45 PM   Result Value Ref Range    CK 1,813.1 (H) 39 - 166 ng/mL   METABOLIC PANEL, COMPREHENSIVE    Collection Time: 08/15/19  6:45 PM   Result Value Ref Range    Sodium 138 136 - 145 mmol/L    Potassium 4.2 3.5 - 5.1 mmol/L    Chloride 103 97 - 108 mmol/L    CO2 26 21 - 32 mmol/L    Anion gap 9 5 - 15 mmol/L    Glucose 77 65 - 100 mg/dL    BUN 14 6 - 20 mg/dL    Creatinine 1.29 0.70 - 1.30 mg/dL    BUN/Creatinine ratio 11 (L) 12 - 20      GFR est AA >60 >60 ml/min/1.63m     GFR est non-AA 58 (L) >60 ml/min/1.713m   Calcium 9.2 8.5 - 10.1 mg/dL    Bilirubin, total 0.5 0.2 - 1.0 mg/dL    AST (SGOT) 59 (H) 15 - 37 U/L    ALT (SGPT) 55 12 - 78 U/L    Alk. phosphatase 90 45 - 117 U/L    Protein, total 8.3 (H) 6.4 - 8.2 g/dL    Albumin 4.0 3.5 - 5.0 g/dL    Globulin 4.3 (H) 2.0 - 4.0 g/dL    A-G Ratio 0.9 (L) 1.1 - 2.2     CK-MB,QUANT.    Collection Time: 08/15/19  6:45 PM   Result Value Ref Range    CK - MB 6.3 (H) <3.6 ng/mL    CK-MB Index 0.3     METABOLIC PANEL, BASIC    Collection Time: 08/16/19 12:29 AM   Result Value Ref Range    Sodium 140 136 - 145 mmol/L    Potassium 3.7 3.5 - 5.1 mmol/L    Chloride 103 97 - 108 mmol/L    CO2 31 21 - 32 mmol/L    Anion gap 6 5 - 15 mmol/L    Glucose 86 65 - 100 mg/dL    BUN 14 6 - 20 mg/dL    Creatinine 1.30 0.70 - 1.30 mg/dL    BUN/Creatinine ratio 11 (L) 12 - 20      GFR est AA >60 >60 ml/min/1.7362m  GFR est non-AA 58 (L) >60 ml/min/1.35m57m Calcium 8.6 8.5 - 10.106.3dL   METABOLIC PANEL, BASIC    Collection Time: 08/16/19  3:42 AM   Result Value Ref Range    Sodium 139 136 - 145 mmol/L    Potassium 4.0 3.5 - 5.1 mmol/L    Chloride 102 97 - 108 mmol/L    CO2 32 21 - 32 mmol/L    Anion gap 5 5 - 15 mmol/L    Glucose 88 65 - 100 mg/dL    BUN 15 6 - 20 mg/dL    Creatinine 1.40 (H) 0.70 - 1.30 mg/dL    BUN/Creatinine ratio 11 (L) 12 - 20      GFR est AA >60 >60 ml/min/1.35m260mGFR est non-AA 53 (L) >60 ml/min/1.35m2 27malcium 8.9 8.5 - 10.1 mg/dL   MAGNESIUM    Collection Time: 08/16/19  3:42 AM   Result Value Ref Range    Magnesium 2.1 1.6 - 2.4 mg/dL   CBC WITH AUTOMATED DIFF    Collection Time: 08/16/19  3:42 AM   Result Value Ref Range    WBC  7.1 4.1 - 11.1 K/uL    RBC 5.42 4.10 - 5.70 M/uL    HGB 16.0 12.1 - 17.0 %    HCT 48.0 36.6 - 50.3 %    MCV 88.6 80.0 - 99.0 FL    MCH 29.5 26.0 - 34.0 PG    MCHC 33.3 30.0 - 36.5 g/dL    RDW 13.7 11.5 - 14.5 %    PLATELET 286 150 - 400 K/uL    MPV 11.9 8.9 - 12.9 FL    NEUTROPHILS 67 32 - 75 %     LYMPHOCYTES 23 12 - 49 %    MONOCYTES 8 5 - 13 %    EOSINOPHILS 2 0 - 7 %    BASOPHILS 0 0 - 1 %    IMMATURE GRANULOCYTES 0 0.0 - 0.5 %    ABS. NEUTROPHILS 4.8 1.8 - 8.0 K/UL    ABS. LYMPHOCYTES 1.6 0.8 - 3.5 K/UL    ABS. MONOCYTES 0.5 0.0 - 1.0 K/UL    ABS. EOSINOPHILS 0.1 0.0 - 0.4 K/UL    ABS. BASOPHILS 0.0 0.0 - 0.1 K/UL    ABS. IMM. GRANS. 0.0 0.00 - 0.04 K/UL    DF AUTOMATED         CBC:   Lab Results   Component Value Date/Time    WBC 7.1 08/16/2019 03:42 AM    RBC 5.42 08/16/2019 03:42 AM    HGB 16.0 08/16/2019 03:42 AM    HCT 48.0 08/16/2019 03:42 AM    PLATELET 286 08/16/2019 03:42 AM     BMP:   Lab Results   Component Value Date/Time    Glucose 88 08/16/2019 03:42 AM    Sodium 139 08/16/2019 03:42 AM    Potassium 4.0 08/16/2019 03:42 AM    Chloride 102 08/16/2019 03:42 AM    CO2 32 08/16/2019 03:42 AM    BUN 15 08/16/2019 03:42 AM    Creatinine 1.40 (H) 08/16/2019 03:42 AM    Calcium 8.9 08/16/2019 03:42 AM     Coagulation:   Lab Results   Component Value Date/Time    Prothrombin time 12.1 08/15/2019 12:15 PM    INR 0.9 08/15/2019 12:15 PM    aPTT 31.6 08/15/2019 12:15 PM       Assessment/Plan:     1. Snake Bite  Cycle CK Q8H  Poison control notified  Start IV fluids  Received Crofab antivenom.   Afebrile and WBC WNL.  If continues to worsen may consider ID consult    2. HTN  BP is stable  Continue with metoprolol    Full Code  Heparin for DVT phx.     Care Plan discussed with: Patient/Family and Nurse and attending Dr. Barbaraann Barthel    Total time spent with patient: 71mnutes.

## 2019-08-17 LAB — CBC WITH AUTO DIFFERENTIAL
Basophils %: 0 % (ref 0–1)
Basophils Absolute: 0 10*3/uL (ref 0.0–0.1)
Eosinophils %: 3 % (ref 0–7)
Eosinophils Absolute: 0.1 10*3/uL (ref 0.0–0.4)
Granulocyte Absolute Count: 0 10*3/uL (ref 0.00–0.04)
Hematocrit: 36.9 % (ref 36.6–50.3)
Hemoglobin: 12 % — ABNORMAL LOW (ref 12.1–17.0)
Immature Granulocytes: 0 % (ref 0.0–0.5)
Lymphocytes %: 26 % (ref 12–49)
Lymphocytes Absolute: 1 10*3/uL (ref 0.8–3.5)
MCH: 28.8 PG (ref 26.0–34.0)
MCHC: 32.5 g/dL (ref 30.0–36.5)
MCV: 88.7 FL (ref 80.0–99.0)
MPV: 11.2 FL (ref 8.9–12.9)
Monocytes %: 8 % (ref 5–13)
Monocytes Absolute: 0.3 10*3/uL (ref 0.0–1.0)
Neutrophils %: 63 % (ref 32–75)
Neutrophils Absolute: 2.4 10*3/uL (ref 1.8–8.0)
Platelets: 214 10*3/uL (ref 150–400)
RBC: 4.16 M/uL (ref 4.10–5.70)
RDW: 13.7 % (ref 11.5–14.5)
WBC: 3.8 10*3/uL — ABNORMAL LOW (ref 4.1–11.1)

## 2019-08-17 LAB — BASIC METABOLIC PANEL
Anion Gap: 5 mmol/L (ref 5–15)
BUN: 13 mg/dL (ref 6–20)
Bun/Cre Ratio: 10 — ABNORMAL LOW (ref 12–20)
CO2: 28 mmol/L (ref 21–32)
Calcium: 8.7 mg/dL (ref 8.5–10.1)
Chloride: 110 mmol/L — ABNORMAL HIGH (ref 97–108)
Creatinine: 1.32 mg/dL — ABNORMAL HIGH (ref 0.70–1.30)
EGFR IF NonAfrican American: 57 mL/min/{1.73_m2} — ABNORMAL LOW (ref >60)
GFR African American: >60 mL/min/{1.73_m2} (ref >60)
Glucose: 101 mg/dL — ABNORMAL HIGH (ref 65–100)
Potassium: 4 mmol/L (ref 3.5–5.1)
Sodium: 143 mmol/L (ref 136–145)

## 2019-08-17 LAB — POCT GLUCOSE: POC Glucose: 120 mg/dL — ABNORMAL HIGH (ref 65–100)

## 2019-08-17 LAB — PROTIME-INR
INR: 1.3 — ABNORMAL HIGH (ref 0.9–1.1)
Protime: 16.4 s — ABNORMAL HIGH (ref 11.9–14.7)

## 2019-08-17 LAB — CK
CK: 682 U/L — ABNORMAL HIGH (ref 39–308)
Total CK: 682 U/L — ABNORMAL HIGH (ref 39–308)

## 2019-08-17 LAB — CBC WITH AUTOMATED DIFF
ABS. BASOPHILS: 0 10*3/uL (ref 0.0–0.1)
ABS. EOSINOPHILS: 0.1 10*3/uL (ref 0.0–0.4)
ABS. IMM. GRANS.: 0 10*3/uL (ref 0.00–0.04)
ABS. LYMPHOCYTES: 1 10*3/uL (ref 0.8–3.5)
ABS. MONOCYTES: 0.3 10*3/uL (ref 0.0–1.0)
ABS. NEUTROPHILS: 2.4 10*3/uL (ref 1.8–8.0)
BASOPHILS: 0 % (ref 0–1)
EOSINOPHILS: 3 % (ref 0–7)
HCT: 36.9 % (ref 36.6–50.3)
HGB: 12 % — ABNORMAL LOW (ref 12.1–17.0)
IMMATURE GRANULOCYTES: 0 % (ref 0.0–0.5)
LYMPHOCYTES: 26 % (ref 12–49)
MCH: 28.8 PG (ref 26.0–34.0)
MCHC: 32.5 g/dL (ref 30.0–36.5)
MCV: 88.7 FL (ref 80.0–99.0)
MONOCYTES: 8 % (ref 5–13)
MPV: 11.2 FL (ref 8.9–12.9)
NEUTROPHILS: 63 % (ref 32–75)
PLATELET: 214 10*3/uL (ref 150–400)
RBC: 4.16 M/uL (ref 4.10–5.70)
RDW: 13.7 % (ref 11.5–14.5)
WBC: 3.8 10*3/uL — ABNORMAL LOW (ref 4.1–11.1)

## 2019-08-17 LAB — METABOLIC PANEL, BASIC
Anion gap: 5 mmol/L (ref 5–15)
BUN/Creatinine ratio: 10 — ABNORMAL LOW (ref 12–20)
BUN: 13 mg/dL (ref 6–20)
CO2: 28 mmol/L (ref 21–32)
Calcium: 8.7 mg/dL (ref 8.5–10.1)
Chloride: 110 mmol/L — ABNORMAL HIGH (ref 97–108)
Creatinine: 1.32 mg/dL — ABNORMAL HIGH (ref 0.70–1.30)
GFR est AA: >60 mL/min/{1.73_m2} (ref >60)
GFR est non-AA: 57 mL/min/{1.73_m2} — ABNORMAL LOW (ref >60)
Glucose: 101 mg/dL — ABNORMAL HIGH (ref 65–100)
Potassium: 4 mmol/L (ref 3.5–5.1)
Sodium: 143 mmol/L (ref 136–145)

## 2019-08-17 LAB — GLUCOSE, POC: Glucose (POC): 120 mg/dL — ABNORMAL HIGH (ref 65–100)

## 2019-08-17 LAB — PROTHROMBIN TIME + INR
INR: 1.3 — ABNORMAL HIGH (ref 0.9–1.1)
Prothrombin time: 16.4 s — ABNORMAL HIGH (ref 11.9–14.7)

## 2019-08-17 MED FILL — METOPROLOL SUCCINATE SR 25 MG 24 HR TAB: 25 mg | ORAL | Qty: 1

## 2019-08-17 NOTE — Progress Notes (Signed)
Tammy at [facility] updated on patient condition, labs, and vital signs

## 2019-08-17 NOTE — Progress Notes (Signed)
Left middle

## 2019-08-17 NOTE — Progress Notes (Signed)
Snake bite to left middle finger

## 2019-08-17 NOTE — Discharge Summary (Signed)
Discharge Summary by Margie Ege, PA-C at 08/17/19 1018                Author: Margie Ege, PA-C  Service: Physician Assistant  Author Type: Physician Assistant       Filed: 08/17/19 1021  Date of Service: 08/17/19 1018  Status: Signed           Editor: Margie Ege, PA-C (Physician Assistant)  Cosigner: Alisia Ferrari, MD at 08/20/19 1127                                              Hospitalist Discharge Summary        Patient ID:     Jonathan Decker   [id number]   53 y.o.   1966-01-31      Admit date: 08/15/2019      Discharge date : 08/17/2019      Chronic Diagnoses:        Problem List as of 08/17/2019  Never Reviewed                        Codes  Class  Noted - Resolved             Snake bite  ICD-10-CM: W59.11XA   ICD-9-CM: 989.5, E905.0    08/15/2019 - Present                       Hypertension  ICD-10-CM: I10   ICD-9-CM: 401.9    08/15/2019 - Present                          Final Diagnoses:    1. Snake Bite      Reason for Hospitalization: Snake bite         Hospital Course: Patient is a 53 yo male that presented to the ER on 08/15/2019 after sustaining a snake bite to left hand while playing golf.  CK elevated at 1813.1 and trending down. Poision control was notified. He was given the antivenom Crofab. Started on IV fluids. Overnight on the 08/15/2019 edema and redness in his left hand has increased to elbow. Throughout the day of 9/25 and overnight  till 9/26 swelling has significantly decreased. Plan for discharge later today         Discharge Medications:    There are no discharge medications for this patient.            Follow up Care:     1. Unknown, Provider in 1-2 weeks.          Follow-up Information               Follow up With  Specialties  Details  Why  Contact Info              Unknown, Provider        Patient not available to ask                 Unknown, Provider    In 2 weeks    Patient not available to ask                      Patient Follow Up Instructions:     Activity: Activity as tolerated   Diet:  Regular Diet  Condition at Discharge:  Stable   __________________________________________________________________      Disposition   Home with family, no needs   ____________________________________________________________________      Code Status: Full Code   ___________________________________________________________________      Discharge Exam:   Patient seen and examined by me on discharge day.   Pertinent Findings:   Gen:    Not in distress   Chest: Clear lungs   CVS:   Regular rhythm.  No edema   Abd:  Soft, not distended, not tender   Neuro:  Alert      CONSULTATIONS: None      Significant Diagnostic Studies:      Recent Labs            08/17/19   0535  08/16/19   0342     WBC  3.8*  7.1     HGB  12.0*  16.0     HCT  36.9  48.0         PLT  214  286          Recent Labs             08/16/19   0342  08/16/19   0029  08/15/19   1845     NA  139  140  138     K  4.0  3.7  4.2     CL  102  103  103     CO2  32  31  26     BUN  15  14  14      CREA  1.40*  1.30  1.29     GLU  88  86  77     CA  8.9  8.6  9.2          MG  2.1   --    --           Recent Labs           08/15/19   1845     ALT  55     AP  90     TBILI  0.5     TP  8.3*     ALB  4.0        GLOB  4.3*          Recent Labs            08/17/19   0535  08/15/19   1215     INR  1.3*  0.9     PTP  16.4*  12.1         APTT   --   31.6         No results for input(s): FE, TIBC, PSAT, FERR in the last 72 hours.    No results for input(s): PH, PCO2, PO2 in the last 72 hours.     Recent Labs            08/16/19   1155  08/15/19   1845         CKMB  3.1  6.3*        No results found for: GLUCPOC         Total Time: >30 min       Signed:   Ardean Larsen, PA-C   08/17/2019   10:18 AM

## 2019-08-17 NOTE — Progress Notes (Signed)
Snake bite to left middle finger. Measurements are as follows:  Finger - 6 cm  Hand - 20.5 cm  foearm - 21 cm  Elbow - 27 cm   Patient has no complaints of pain.

## 2019-08-17 NOTE — Progress Notes (Signed)
[facility] at [facility] updated on patient condition, labs, and vital signs

## 2019-08-17 NOTE — Discharge Summary (Signed)
Hospitalist Discharge Summary     Patient ID:    Jonathan Decker  [id number]  53 y.o.  02/05/1966    Admit date: 08/15/2019    Discharge date : 08/17/2019    Chronic Diagnoses:    Problem List as of 08/17/2019 Never Reviewed          Codes Class Noted - Resolved    Snake bite ICD-10-CM: W59.11XA  ICD-9-CM: 989.5, E905.0  08/15/2019 - Present        Hypertension ICD-10-CM: I10  ICD-9-CM: 401.9  08/15/2019 - Present              Final Diagnoses:   1. Snake Bite    Reason for Hospitalization: Snake bite      Hospital Course: Patient is a 52 yo male that presented to the ER on 08/15/2019 after sustaining a snake bite to left hand while playing golf. CK elevated at 1813.1 and trending down. Poision control was notified. He was given the antivenom Crofab. Started on IV fluids. Overnight on the 08/15/2019 edema and redness in his left hand has increased to elbow. Throughout the day of 9/25 and overnight till 9/26 swelling has significantly decreased. Plan for discharge later today      Discharge Medications:   There are no discharge medications for this patient.        Follow up Care:    1. Unknown, Provider in 1-2 weeks.      Follow-up Information     Follow up With Specialties Details Why Contact Info    Unknown, Provider    Patient not available to ask      Unknown, Provider  In 2 weeks  Patient not available to ask              Patient Follow Up Instructions:   Activity: Activity as tolerated  Diet:  Regular Diet    Condition at Discharge:  Stable  __________________________________________________________________    Disposition  Home with family, no needs  ____________________________________________________________________    Code Status: Full Code  ___________________________________________________________________    Discharge Exam:  Patient seen and examined by me on discharge day.  Pertinent Findings:  Gen:    Not in distress  Chest: Clear lungs  CVS:   Regular rhythm.  No edema   Abd:  Soft, not distended, not tender  Neuro:  Alert    CONSULTATIONS: None    Significant Diagnostic Studies:   Recent Labs     08/17/19  0535 08/16/19  0342   WBC 3.8* 7.1   HGB 12.0* 16.0   HCT 36.9 48.0   PLT 214 286     Recent Labs     08/16/19  0342 08/16/19  0029 08/15/19  1845   NA 139 140 138   K 4.0 3.7 4.2   CL 102 103 103   CO2 32 31 26   BUN 15 14 14    CREA 1.40* 1.30 1.29   GLU 88 86 77   CA 8.9 8.6 9.2   MG 2.1  --   --      Recent Labs     08/15/19  1845   ALT 55   AP 90   TBILI 0.5   TP 8.3*   ALB 4.0   GLOB 4.3*     Recent Labs     08/17/19  0535 08/15/19  1215   INR 1.3* 0.9   PTP 16.4* 12.1   APTT  --  31.6  No results for input(s): FE, TIBC, PSAT, FERR in the last 72 hours.   No results for input(s): PH, PCO2, PO2 in the last 72 hours.  Recent Labs     08/16/19  1155 08/15/19  1845   CKMB 3.1 6.3*     No results found for: GLUCPOC      Total Time: >30 min     Signed:  Ardean Larsen, PA-C  08/17/2019  10:18 AM

## 2019-08-17 NOTE — Progress Notes (Signed)
Snake bite to left middle finger. Measurements are as follows:  Finger - 6 cm  Hand - 20.5 cm  foearm - 21 cm  Elbow - 27 cm   Patient has no complaints of pain.

## 2019-08-17 NOTE — Progress Notes (Signed)
Snake bite to left middle finger

## 2019-08-23 LAB — CULTURE, BLOOD, PAIRED
Culture result:: NO GROWTH
Culture: NO GROWTH

## 2020-01-23 ENCOUNTER — Ambulatory Visit: Payer: Self-pay

## 2020-01-26 ENCOUNTER — Ambulatory Visit: Payer: Self-pay | Attending: Internal Medicine

## 2020-01-26 DIAGNOSIS — Z23 Encounter for immunization: Secondary | ICD-10-CM | POA: Insufficient documentation

## 2020-01-26 NOTE — Progress Notes (Signed)
   Covid-[facility] Vaccination [facility]  Name:  Darryl [name]    MRN: [id number] DOB: 10-06-1966  01/26/2020  [name] was observed post Covid-19 immunization for 15 minutes without incident. He was provided with Vaccine Information Sheet and instruction to access the V-Safe system.   [name] was instructed to call 911 with any severe reactions post vaccine: Marland Kitchen Difficulty breathing  . Swelling of face and throat  . A fast heartbeat  . A bad rash all over body  . Dizziness and weakness   Immunizations Administered    Name Date Dose VIS Date Route   Pfizer COVID-19 Vaccine 01/26/2020  5:36 PM 0.3 mL 11/01/2019 Intramuscular   Manufacturer: [employer], [employer]   Lot: [id number]   NDC: [address]-9

## 2020-02-25 ENCOUNTER — Ambulatory Visit: Payer: Self-pay | Attending: Internal Medicine

## 2020-02-25 DIAGNOSIS — Z23 Encounter for immunization: Secondary | ICD-10-CM

## 2020-02-25 NOTE — Progress Notes (Signed)
   Covid-19 Vaccination Clinic  Name:  Darryl [name]    MRN: [id number] DOB: Nov 06, 1966  02/25/2020  Mr. Gurney was observed post Covid-19 immunization for 15 minutes without incident. He was provided with Vaccine Information Sheet and instruction to access the V-Safe system.   Mr. Lepore was instructed to call 911 with any severe reactions post vaccine: Marland Kitchen Difficulty breathing  . Swelling of face and throat  . A fast heartbeat  . A bad rash all over body  . Dizziness and weakness   Immunizations Administered    Name Date Dose VIS Date Route   Pfizer COVID-19 Vaccine 02/25/2020 11:22 AM 0.3 mL 11/01/2019 Intramuscular   Manufacturer: [employer], [employer]   Lot: [id number]   NDC: [address]-9
